# Patient Record
Sex: Male | Born: 1990
Health system: Southern US, Community
[De-identification: ages and names within clinical notes are randomized; demographics above are authoritative.]

## PROBLEM LIST (undated history)

## (undated) DIAGNOSIS — F419 Anxiety disorder, unspecified: Secondary | ICD-10-CM

## (undated) DIAGNOSIS — D649 Anemia, unspecified: Secondary | ICD-10-CM

## (undated) HISTORY — DX: Anemia, unspecified: D64.9

## (undated) HISTORY — DX: Anxiety disorder, unspecified: F41.9

---
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
## 2012-03-11 ENCOUNTER — Ambulatory Visit: Payer: Self-pay | Admitting: Orthopedic Surgery

## 2014-03-12 IMAGING — CT CT WRIST*L* W/O CM
1 series · 12 of 14 positions shown, 15 images · non-contrast
Comparison: none

REASON FOR EXAM: intraarticular fracture
COMMENTS:

PROCEDURE:     KCT - KCT WRIST LEFT WITHOUT CONTRAST  - March 11, 2012  [DATE]
RESULT:
TECHNIQUE: Helical 2 mm multiplanar images were obtained of the left wrist.

[Series 3: wrist 2.0 b70s · axial · 0.32mm/px · z∈[-139,-15]mm · 12 of 74 slices shown, 15 images]
[im 6/74  soft-tissue]
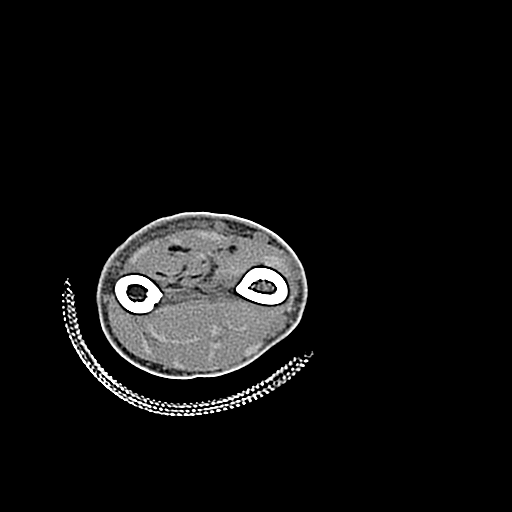
[im 6/74  bone]
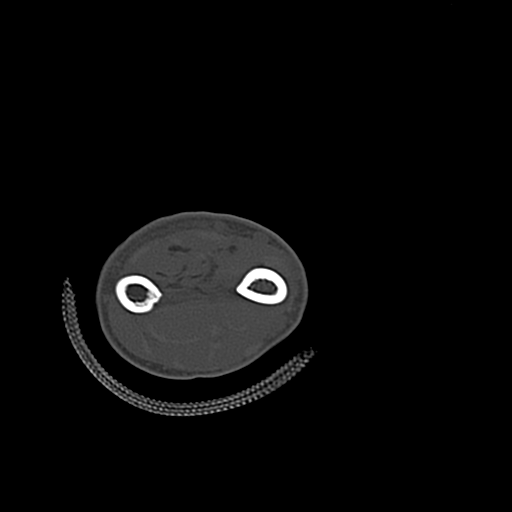
[im 12/74  bone]
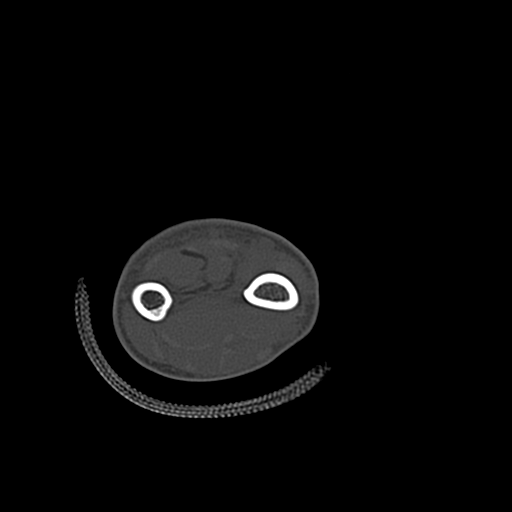
[im 17/74  bone]
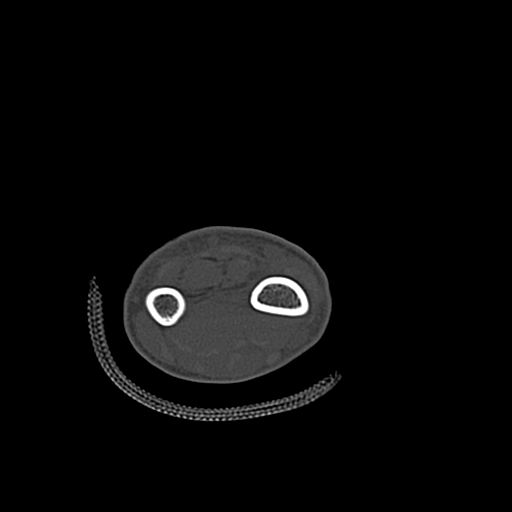
[im 23/74  bone]
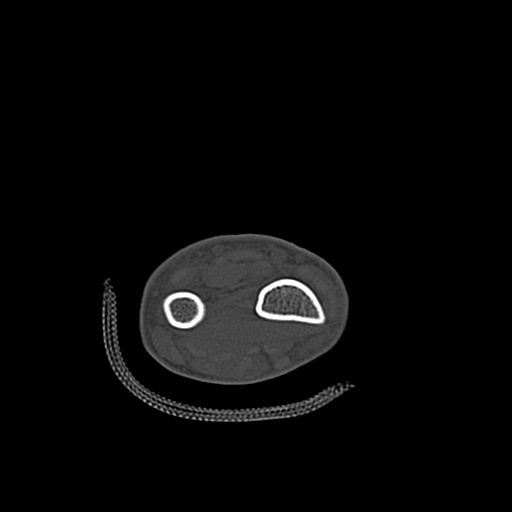
[im 29/74  soft-tissue]
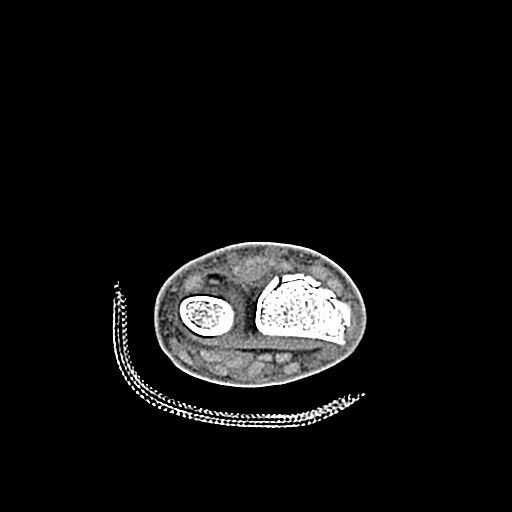
[im 29/74  bone]
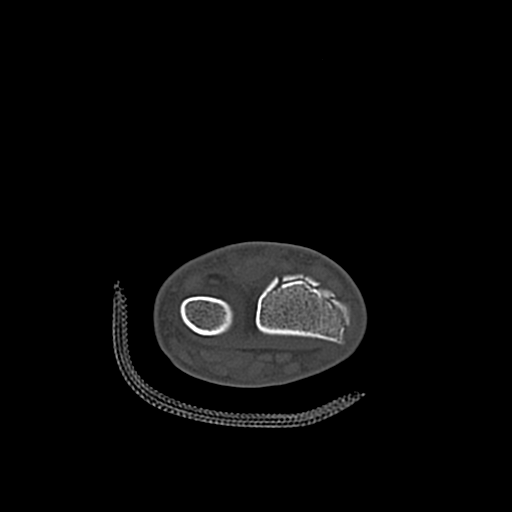
[im 34/74  bone]
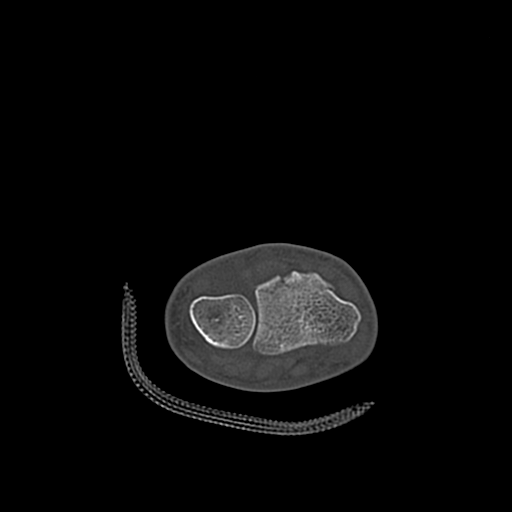
[im 40/74  bone]
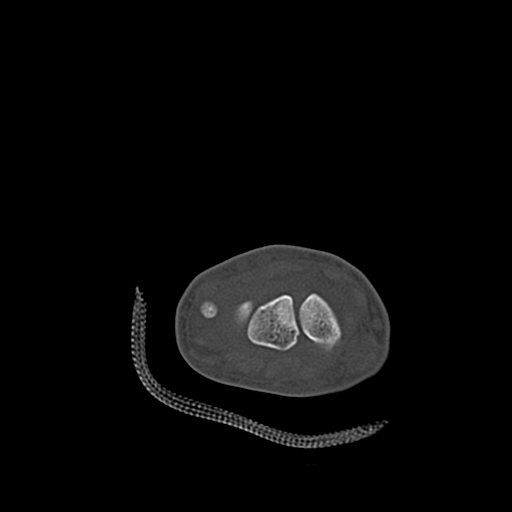
[im 45/74  bone]
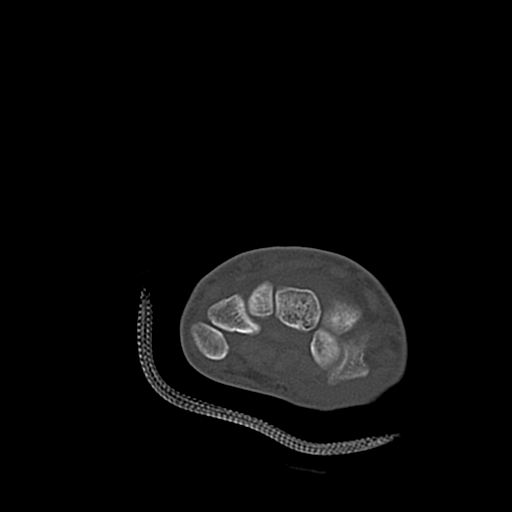
[im 51/74  soft-tissue]
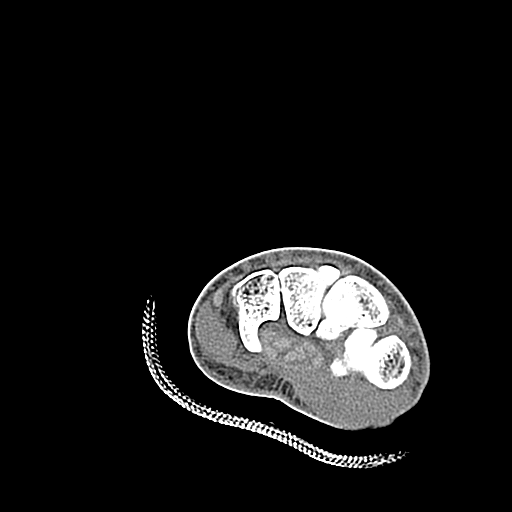
[im 51/74  bone]
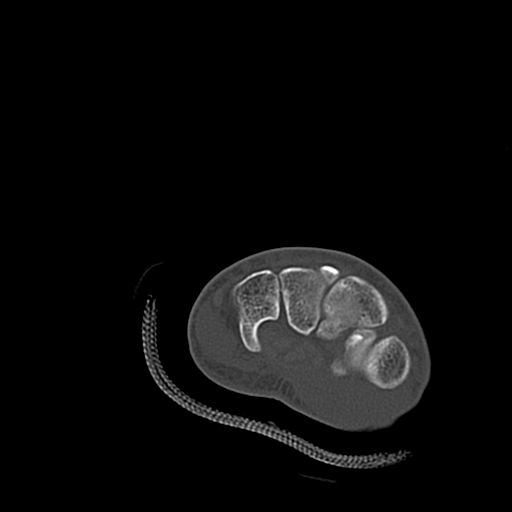
[im 57/74  bone]
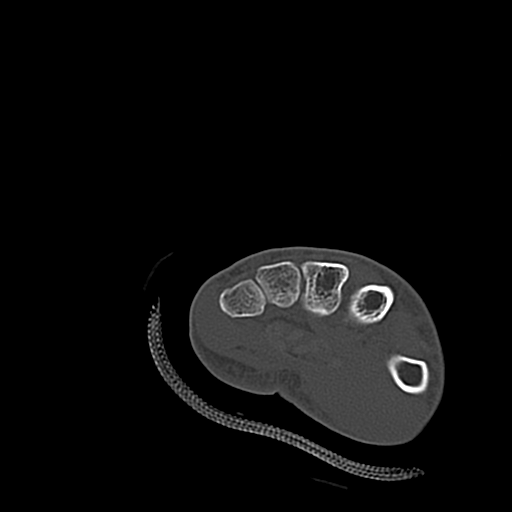
[im 62/74  bone]
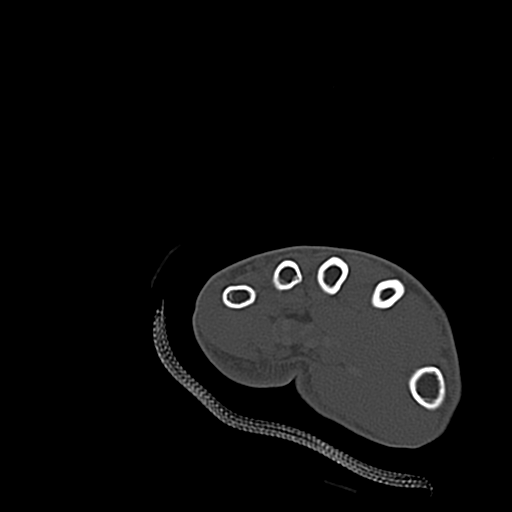
[im 68/74  bone]
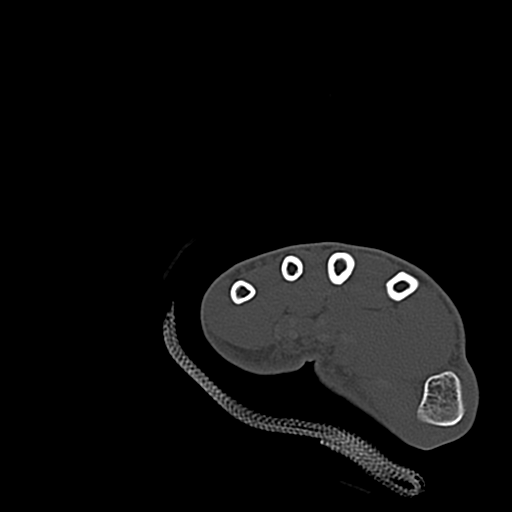

[12 of 14 positions shown; findings below may reference images not displayed]

FINDINGS: A comminuted mild to moderately impacted fracture is identified
within the distal metaphyseal region of the radius. There is extension of
the fracture into the distal radial ulnar joint. There does not appear to be
radiographic evidence of osseous fragments within the joint. There is no
evidence of extension into the radial carpal articulation. No further
fractures or dislocations are appreciated.
IMPRESSION: 1. Distal radius fracture as described above with extension into the distal
radial ulnar articulation.

## 2015-11-18 ENCOUNTER — Telehealth: Payer: Self-pay | Admitting: Family Medicine

## 2015-11-18 NOTE — Telephone Encounter (Signed)
Please review-aa 

## 2015-11-18 NOTE — Telephone Encounter (Signed)
Pt's mom Stanton KidneyDebra stated that she wanted pt to come in for OV b/c he hasn't been seen in awhile. Last OV was 08/11/12 a little over 3 years ago. Can pt be re-established? Thanks TNP

## 2015-11-18 NOTE — Telephone Encounter (Signed)
Called mom. No answer,LMTCB. Thanks TNP

## 2015-11-18 NOTE — Telephone Encounter (Signed)
That's fine

## 2015-11-22 NOTE — Telephone Encounter (Signed)
Called Mom's #. No answer. LMTCB. Thanks TNP

## 2020-08-23 ENCOUNTER — Telehealth: Payer: Self-pay

## 2020-08-23 NOTE — Telephone Encounter (Signed)
Copied from CRM [id number]. Topic: Appointment Scheduling - Scheduling Inquiry for Clinic >> Aug 23, 2020 12:30 PM Randol Kern wrote: Reason for CRM: Pt's mother called and reported that she wants Dr. Sherrie Mustache to consider taking pt on as a returning patient. She says the whole family is established with Dr. Sherrie Mustache. Pt is struggling with attention issues, wants to be evaluated. Brother is diagnosed with ADHD. Pt's mother wants to keep everyone in the family with Dr. Sherrie Mustache, family is also great friends Dr. Sullivan Lone.   She says herself, father, son, daughter, grandson, son-in-law  Bruce Pittman  Best contact: [phone]

## 2020-08-26 NOTE — Telephone Encounter (Signed)
That's fine

## 2020-08-29 NOTE — Telephone Encounter (Signed)
Patient scheduled for Mon, 10/10/20 first appointment available but mom want to know if it is possible for him to be seen before that date Please call her at ph# [phone]

## 2020-08-30 NOTE — Telephone Encounter (Signed)
There's nothing available

## 2020-10-10 ENCOUNTER — Ambulatory Visit (INDEPENDENT_AMBULATORY_CARE_PROVIDER_SITE_OTHER): Payer: BC Managed Care – PPO | Admitting: Family Medicine

## 2020-10-10 ENCOUNTER — Encounter: Payer: Self-pay | Admitting: Family Medicine

## 2020-10-10 ENCOUNTER — Other Ambulatory Visit: Payer: Self-pay

## 2020-10-10 VITALS — BP 119/79 | HR 103 | Temp 98.1°F | Resp 16 | Ht >= 80 in | Wt 177.0 lb

## 2020-10-10 DIAGNOSIS — F909 Attention-deficit hyperactivity disorder, unspecified type: Secondary | ICD-10-CM | POA: Diagnosis not present

## 2020-10-10 MED ORDER — AMPHETAMINE-DEXTROAMPHET ER 10 MG PO CP24: 10.0000 mg | ORAL_CAPSULE | Freq: Every day | ORAL | 0 refills | Status: DC

## 2020-10-10 NOTE — Progress Notes (Signed)
I,April Miller,acting as a scribe for [name], MD.,have documented all relevant documentation on the behalf of [name], MD,as directed by  [name], MD while in the presence of [name], MD.  New patient visit   Patient: Bruce Pittman   DOB: 1991/02/18   30 y.o. Male  MRN: [id number] Visit Date: 10/10/2020  Today's healthcare provider: [name], MD   Chief Complaint  Patient presents with   Establish Care   Subjective    Bruce Pittman is a 30 y.o. male who presents today as a new patient to establish care.  HPI  He is mainly here to discuss ADD symptoms, which runs in his family.  Associated degree [employer]. 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   Past Medical History:  Diagnosis Date   Anemia    Anxiety    History reviewed. No pertinent surgical history. No family status information on file.   History reviewed. No pertinent family history. Social History   Socioeconomic History   Marital status: Married    Spouse name: Not on file   Number of children: Not on file   Years of education: Not on file   Highest education level: Not on file  Occupational History   Not on file  Tobacco Use   Smoking status: Never   Smokeless tobacco: Current  Vaping Use   Vaping Use: Never used  Substance and Sexual Activity   Alcohol use: Yes   Drug use: Never   Sexual activity: Not on file  Other Topics Concern   Not on file  Social History Narrative   Not on file   Social Determinants of Health   Financial Resource Strain: Not on file  Food Insecurity: Not on file  Transportation Needs: Not on file  Physical Activity: Not on file  Stress: Not on file  Social Connections: Not on file   No outpatient medications prior to visit.   No facility-administered medications prior to visit.   Not on File   There  is no immunization history on file for this patient.  Health Maintenance  Topic Date Due   HIV Screening  Never done   Hepatitis C Screening  Never done   TETANUS/TDAP  Never done   INFLUENZA VACCINE  10/10/2020   Pneumococcal Vaccine 77-91 Years old  Aged Out   HPV VACCINES  Aged Out    Patient Care Team: Malva Limes, MD as PCP - General (Family Medicine)  Review of Systems  Constitutional:  Positive for activity change.  Psychiatric/Behavioral:  Positive for agitation, decreased concentration, dysphoric mood and sleep disturbance. The patient is nervous/anxious.   All other systems reviewed and are negative.    Objective    BP 119/79 (BP Location: Left Arm, Patient Position: Sitting, Cuff Size: Large)   Pulse (!) 103   Temp 98.1 F (36.7 C) (Temporal)   Resp 16   Ht 6\' 8"  (2.032 m)   Wt 177 lb (80.3 kg)   SpO2 96%   BMI 19.44 kg/m  Physical Exam  General: Appearance:    Thin male in no acute distress  Eyes:    PERRL, conjunctiva/corneas clear, EOM's intact       Lungs:     Clear to auscultation bilaterally, respirations unlabored  Heart:    Tachycardic. Normal rhythm. No murmurs, rubs, or gallops.  MS:   All extremities are intact.    Neurologic:   Awake, alert, oriented x 3. No apparent focal neurological defect.       See ASRS Flowsheet Depression Screen PHQ 2/9 Scores 10/10/2020  PHQ - 2 Score 0  PHQ- 9 Score 8     Assessment & Plan     1. Attention deficit hyperactivity disorder (ADHD), unspecified ADHD type Counseled on various treatment modalities including CBT and various medications. Discussed potential adverse effects of medications. Will try - amphetamine-dextroamphetamine (ADDERALL XR) 10 MG 24 hr capsule; Take 1 capsule (10 mg total) by mouth daily.  Dispense: 30 capsule; Refill: 0  Counseled to avoid stimulant medications. Is to follow up in 4 weeks to reassess.      The entirety of the information documented in the History of Present  Illness, Review of Systems and Physical Exam were personally obtained by me. Portions of this information were initially documented by the CMA and reviewed by me for thoroughness and accuracy.     [name], MD  [place] [phone] (phone) [phone] (fax)  [facility]

## 2020-11-08 ENCOUNTER — Other Ambulatory Visit: Payer: Self-pay | Admitting: Family Medicine

## 2020-11-08 DIAGNOSIS — F909 Attention-deficit hyperactivity disorder, unspecified type: Secondary | ICD-10-CM

## 2020-11-09 ENCOUNTER — Other Ambulatory Visit: Payer: Self-pay | Admitting: Family Medicine

## 2020-11-09 DIAGNOSIS — F909 Attention-deficit hyperactivity disorder, unspecified type: Secondary | ICD-10-CM

## 2020-11-09 MED ORDER — AMPHETAMINE-DEXTROAMPHET ER 10 MG PO CP24: 10.0000 mg | ORAL_CAPSULE | Freq: Every day | ORAL | 0 refills | Status: DC

## 2020-11-09 NOTE — Addendum Note (Signed)
Addended by: Malva Limes on: 11/09/2020 09:40 AM   Modules accepted: Orders

## 2020-11-10 NOTE — Telephone Encounter (Signed)
Please call pharmacy to see if this went through. I sent prescription to pharmacy twice yesterday and got message that transmission failed both times.

## 2020-11-15 ENCOUNTER — Encounter: Payer: Self-pay | Admitting: Family Medicine

## 2020-11-15 ENCOUNTER — Other Ambulatory Visit: Payer: Self-pay

## 2020-11-15 ENCOUNTER — Ambulatory Visit: Payer: BC Managed Care – PPO | Admitting: Family Medicine

## 2020-11-15 DIAGNOSIS — F909 Attention-deficit hyperactivity disorder, unspecified type: Secondary | ICD-10-CM | POA: Diagnosis not present

## 2020-11-15 MED ORDER — AMPHETAMINE-DEXTROAMPHET ER 15 MG PO CP24: 15.0000 mg | ORAL_CAPSULE | Freq: Every day | ORAL | 0 refills | Status: DC

## 2020-11-15 NOTE — Progress Notes (Signed)
      Established patient visit   Patient: Bruce Pittman   DOB: 01-31-1991   30 y.o. Male  MRN: [id number] Visit Date: 11/15/2020  Today's healthcare provider: [name], MD   Chief Complaint  Patient presents with   ADHD   Subjective    HPI   Follow up for ADHD  The patient was last seen for this 1  month  ago. Changes made at last visit include starting Adderall XR 10mg  daily.  He reports excellent compliance with treatment. He feels that condition is Improved. He is having side effects, but pt states the side effects have improved.    -----------------------------------------------------------------------------------------    Medications: Outpatient Medications Prior to Visit  Medication Sig   amphetamine-dextroamphetamine (ADDERALL XR) 10 MG 24 hr capsule TAKE 1 CAPSULE BY MOUTH DAILY.   No facility-administered medications prior to visit.    Review of Systems  Constitutional: Negative.   Respiratory: Negative.    Cardiovascular: Negative.   Gastrointestinal: Negative.   Psychiatric/Behavioral:  Positive for decreased concentration (Since being out of Adderall). Negative for agitation, behavioral problems, confusion, dysphoric mood, hallucinations, self-injury, sleep disturbance and suicidal ideas. The patient is not nervous/anxious and is not hyperactive.       Objective    BP 120/78 (BP Location: Right Arm, Patient Position: Sitting, Cuff Size: Normal)   Pulse 88   Wt 178 lb (80.7 kg)   SpO2 98%   BMI 19.55 kg/m    Physical Exam   General appearance: Thin male, cooperative and in no acute distress Head: Normocephalic, without obvious abnormality, atraumatic Respiratory: Respirations even and unlabored, normal respiratory rate Extremities: All extremities are intact.  Skin: Skin color, texture, turgor normal. No rashes seen  Psych: Appropriate mood and affect. Neurologic: Mental status: Alert, oriented to person, place, and time, thought  content appropriate.     Assessment & Plan     1. Attention deficit hyperactivity disorder (ADHD), unspecified ADHD type Tolerating starting dose of Adderall well, but sx worse again since running out of medications last week. He would like to try a little stronger dose, will restart at amphetamine-dextroamphetamine (ADDERALL XR) 15 MG 24 hr capsule; Take 1 capsule by mouth daily.  Dispense: 30 capsule; Refill: 0   Future Appointments  Date Time Provider Department Center  12/13/2020  4:00 PM 02/12/2021, MD [facility]    He declined flu vaccine.      The entirety of the information documented in the History of Present Illness, Review of Systems and Physical Exam were personally obtained by me. Portions of this information were initially documented by the CMA and reviewed by me for thoroughness and accuracy.     Malva Limes, MD  [facility] [phone] (phone) [phone] (fax)  [facility]

## 2020-12-13 ENCOUNTER — Encounter: Payer: Self-pay | Admitting: Family Medicine

## 2020-12-13 ENCOUNTER — Ambulatory Visit: Payer: BC Managed Care – PPO | Admitting: Family Medicine

## 2020-12-13 ENCOUNTER — Other Ambulatory Visit: Payer: Self-pay

## 2020-12-13 VITALS — BP 155/98 | HR 91 | Wt 173.0 lb

## 2020-12-13 DIAGNOSIS — F909 Attention-deficit hyperactivity disorder, unspecified type: Secondary | ICD-10-CM | POA: Diagnosis not present

## 2020-12-13 MED ORDER — AMPHETAMINE-DEXTROAMPHETAMINE 15 MG PO TABS: 15.0000 mg | ORAL_TABLET | Freq: Every day | ORAL | 0 refills | Status: DC

## 2020-12-13 NOTE — Patient Instructions (Signed)
Call or send a MyChart message 3-4 days before you need a refill of Adderall. Let me know if you want to continue the 15mg  immediate release or go back to the 10mg  extended release tablets

## 2020-12-13 NOTE — Progress Notes (Signed)
      Established patient visit   Patient: Bruce Pittman   DOB: Jan 29, 1991   30 y.o. Male  MRN: [id number] Visit Date: 12/13/2020  Today's healthcare provider: [name], MD   Chief Complaint  Patient presents with   ADHD   Subjective    HPI  Follow up for ADHD  The patient was last seen for this 1  month  ago. Changes made at last visit include increasing adderall to 15mg  daily.  He reports excellent compliance with treatment. He feels that condition is Improved. He is having side effects. Pt reports having trouble sleeping at night.   -----------------------------------------------------------------------------------------     Medications: Outpatient Medications Prior to Visit  Medication Sig   amphetamine-dextroamphetamine (ADDERALL XR) 15 MG 24 hr capsule Take 1 capsule by mouth daily.   No facility-administered medications prior to visit.    Review of Systems  Constitutional:  Positive for appetite change. Negative for activity change, chills, diaphoresis, fatigue, fever and unexpected weight change.  Respiratory: Negative.    Cardiovascular: Negative.   Gastrointestinal: Negative.   Psychiatric/Behavioral:  Positive for decreased concentration (Pt states this has improved.) and sleep disturbance. Negative for dysphoric mood, self-injury and suicidal ideas. The patient is not nervous/anxious.       Objective    BP (!) 155/98 (BP Location: Left Arm, Patient Position: Sitting, Cuff Size: Normal)   Pulse 91   Wt 173 lb (78.5 kg)   SpO2 100%   BMI 19.01 kg/m    Physical Exam   General appearance: Well developed, well nourished male, cooperative and in no acute distress Head: Normocephalic, without obvious abnormality, atraumatic Respiratory: Respirations even and unlabored, normal respiratory rate Extremities: All extremities are intact.  Skin: Skin color, texture, turgor normal. No rashes seen  Psych: Appropriate mood and affect. Neurologic:  Mental status: Alert, oriented to person, place, and time, thought content appropriate.    Assessment & Plan     1. Attention deficit hyperactivity disorder (ADHD), unspecified ADHD type Symptoms better but having more trouble falling asleep which was not a problem before starting medication will change from ER to IR amphetamine-dextroamphetamine (ADDERALL) 15 MG tablet; Take 1 tablet by mouth daily.  Dispense: 30 tablet; Refill: 0   He can call if he feels he needs to change to BID dosing. Otherwise: Future Appointments  Date Time Provider Department Center  03/22/2021  8:00 AM 05/20/2021, Sherrie Mustache, MD [facility]     He declined flu vaccine today.      The entirety of the information documented in the History of Present Illness, Review of Systems and Physical Exam were personally obtained by me. Portions of this information were initially documented by the CMA and reviewed by me for thoroughness and accuracy.     Demetrios Isaacs, MD  [facility] [phone] (phone) [phone] (fax)  [facility]

## 2021-01-10 ENCOUNTER — Other Ambulatory Visit: Payer: Self-pay | Admitting: Family Medicine

## 2021-01-10 DIAGNOSIS — F909 Attention-deficit hyperactivity disorder, unspecified type: Secondary | ICD-10-CM

## 2021-01-10 MED ORDER — AMPHETAMINE-DEXTROAMPHETAMINE 15 MG PO TABS: 15.0000 mg | ORAL_TABLET | Freq: Every day | ORAL | 0 refills | Status: DC

## 2021-01-10 NOTE — Telephone Encounter (Signed)
Last refill: 12/13/2020, qty 30 with 0 refills  Last office visit:03/22/2021 Next office visit: 12/13/2020

## 2021-02-07 ENCOUNTER — Other Ambulatory Visit: Payer: Self-pay | Admitting: Family Medicine

## 2021-02-07 DIAGNOSIS — F909 Attention-deficit hyperactivity disorder, unspecified type: Secondary | ICD-10-CM

## 2021-02-07 MED ORDER — AMPHETAMINE-DEXTROAMPHETAMINE 15 MG PO TABS: 15.0000 mg | ORAL_TABLET | Freq: Every day | ORAL | 0 refills | Status: DC

## 2021-03-07 ENCOUNTER — Other Ambulatory Visit: Payer: Self-pay | Admitting: Family Medicine

## 2021-03-07 DIAGNOSIS — F909 Attention-deficit hyperactivity disorder, unspecified type: Secondary | ICD-10-CM

## 2021-03-07 MED ORDER — AMPHETAMINE-DEXTROAMPHETAMINE 15 MG PO TABS: 15.0000 mg | ORAL_TABLET | Freq: Every day | ORAL | 0 refills | Status: DC

## 2021-03-22 ENCOUNTER — Ambulatory Visit: Payer: BC Managed Care – PPO | Admitting: Family Medicine

## 2021-04-05 ENCOUNTER — Encounter: Payer: Self-pay | Admitting: Family Medicine

## 2021-04-05 ENCOUNTER — Ambulatory Visit: Payer: BC Managed Care – PPO | Admitting: Family Medicine

## 2021-04-05 ENCOUNTER — Other Ambulatory Visit: Payer: Self-pay

## 2021-04-05 VITALS — BP 153/96 | HR 116 | Temp 98.7°F | Resp 16 | Wt 178.0 lb

## 2021-04-05 DIAGNOSIS — R000 Tachycardia, unspecified: Secondary | ICD-10-CM | POA: Diagnosis not present

## 2021-04-05 DIAGNOSIS — F909 Attention-deficit hyperactivity disorder, unspecified type: Secondary | ICD-10-CM

## 2021-04-05 DIAGNOSIS — R030 Elevated blood-pressure reading, without diagnosis of hypertension: Secondary | ICD-10-CM | POA: Diagnosis not present

## 2021-04-05 MED ORDER — AMPHETAMINE-DEXTROAMPHETAMINE 15 MG PO TABS: 15.0000 mg | ORAL_TABLET | Freq: Every day | ORAL | 0 refills | Status: DC

## 2021-04-05 NOTE — Progress Notes (Signed)
°  ° ° °  Established patient visit   Patient: Bruce Pittman   DOB: Jul 17, 1990   31 y.o. Male  MRN: [id number] Visit Date: 04/05/2021  Today's healthcare provider: [name], MD   Chief Complaint  Patient presents with   ADHD   Subjective    HPI  Follow up for ADHD:  The patient was last seen for this on 12/13/2020.   Changes made at last visit include changing Adderall ER to IR amphetamine-dextroamphetamine (ADDERALL) 15 MG tablet; Take 1 tablet by mouth daily.  He reports good compliance with treatment. He feels that condition is Improved. He is not having side effects.   -----------------------------------------------------------------------------------------   Medications: Outpatient Medications Prior to Visit  Medication Sig   amphetamine-dextroamphetamine (ADDERALL) 15 MG tablet Take 1 tablet by mouth daily.   No facility-administered medications prior to visit.    Review of Systems  Constitutional:  Negative for appetite change, chills and fever.  Respiratory:  Negative for chest tightness, shortness of breath and wheezing.   Cardiovascular:  Negative for chest pain and palpitations.  Gastrointestinal:  Negative for abdominal pain, nausea and vomiting.      Objective    BP (!) 153/96 (BP Location: Right Arm, Patient Position: Sitting, Cuff Size: Normal)    Pulse (!) 116    Temp 98.7 F (37.1 C) (Oral)    Resp 16    Wt 178 lb (80.7 kg)    SpO2 100% Comment: room air   BMI 19.55 kg/m  {Show previous vital signs (optional):23777}  Today's Vitals   04/05/21 1101 04/05/21 1106  BP: (!) 155/89 (!) 153/96  Pulse: 100 (!) 116  Resp: 16   Temp: 98.7 F (37.1 C)   TempSrc: Oral   SpO2: 100%   Weight: 178 lb (80.7 kg)    Body mass index is 19.55 kg/m.   Physical Exam    General: Appearance:    Thin male in no acute distress  Eyes:    PERRL, conjunctiva/corneas clear, EOM's intact       Lungs:     Clear to auscultation bilaterally, respirations  unlabored  Heart:    Tachycardic. Normal rhythm. No murmurs, rubs, or gallops.    MS:   All extremities are intact.    Neurologic:   Awake, alert, oriented x 3. No apparent focal neurological defect.        EKG: ST   Assessment & Plan     1. Tachycardia  - EKG 12-Lead  2. Elevated blood pressure reading  - CBC - Comprehensive metabolic panel - TSH  Consider adding CCB if labs are normal.   3. Attention deficit hyperactivity disorder (ADHD), unspecified ADHD type He feels IR Adderall is working better, but is noted to have elevated BP and heart rate as above. He does feel more anxious especially when coming to the Doctor's office, but he also has family history of hypertension.   Will continue current dose of Adderall now and monitory BP and HR.       The entirety of the information documented in the History of Present Illness, Review of Systems and Physical Exam were personally obtained by me. Portions of this information were initially documented by the CMA and reviewed by me for thoroughness and accuracy.     [name], MD  [facility] [phone] (phone) [phone] (fax)  [facility]

## 2021-05-02 DIAGNOSIS — R030 Elevated blood-pressure reading, without diagnosis of hypertension: Secondary | ICD-10-CM | POA: Diagnosis not present

## 2021-05-02 DIAGNOSIS — R000 Tachycardia, unspecified: Secondary | ICD-10-CM | POA: Diagnosis not present

## 2021-05-03 LAB — TSH: TSH: 1.82 u[IU]/mL (ref 0.450–4.500)

## 2021-05-03 LAB — COMPREHENSIVE METABOLIC PANEL
ALT: 11 IU/L (ref 0–44)
AST: 24 IU/L (ref 0–40)
Albumin/Globulin Ratio: 2.4 — ABNORMAL HIGH (ref 1.2–2.2)
Albumin: 5.2 g/dL (ref 4.1–5.2)
Alkaline Phosphatase: 64 IU/L (ref 44–121)
BUN/Creatinine Ratio: 8 — ABNORMAL LOW (ref 9–20)
BUN: 9 mg/dL (ref 6–20)
Bilirubin Total: 0.4 mg/dL (ref 0.0–1.2)
CO2: 24 mmol/L (ref 20–29)
Calcium: 9.8 mg/dL (ref 8.7–10.2)
Chloride: 100 mmol/L (ref 96–106)
Creatinine, Ser: 1.07 mg/dL (ref 0.76–1.27)
Globulin, Total: 2.2 g/dL (ref 1.5–4.5)
Glucose: 93 mg/dL (ref 70–99)
Potassium: 4 mmol/L (ref 3.5–5.2)
Sodium: 139 mmol/L (ref 134–144)
Total Protein: 7.4 g/dL (ref 6.0–8.5)
eGFR: 96 mL/min/{1.73_m2} (ref >59)

## 2021-05-03 LAB — CBC
Hematocrit: 44.5 % (ref 37.5–51.0)
Hemoglobin: 14.9 g/dL (ref 13.0–17.7)
MCH: 29.6 pg (ref 26.6–33.0)
MCHC: 33.5 g/dL (ref 31.5–35.7)
MCV: 88 fL (ref 79–97)
Platelets: 243 10*3/uL (ref 150–450)
RBC: 5.04 x10E6/uL (ref 4.14–5.80)
RDW: 12.7 % (ref 11.6–15.4)
WBC: 4.8 10*3/uL (ref 3.4–10.8)

## 2021-05-04 ENCOUNTER — Other Ambulatory Visit: Payer: Self-pay | Admitting: Family Medicine

## 2021-05-04 ENCOUNTER — Telehealth: Payer: Self-pay

## 2021-05-04 DIAGNOSIS — R030 Elevated blood-pressure reading, without diagnosis of hypertension: Secondary | ICD-10-CM

## 2021-05-04 DIAGNOSIS — F909 Attention-deficit hyperactivity disorder, unspecified type: Secondary | ICD-10-CM

## 2021-05-04 DIAGNOSIS — R000 Tachycardia, unspecified: Secondary | ICD-10-CM

## 2021-05-04 MED ORDER — DILTIAZEM HCL ER COATED BEADS 120 MG PO CP24: 120.0000 mg | ORAL_CAPSULE | Freq: Every day | ORAL | 1 refills | Status: DC

## 2021-05-04 NOTE — Telephone Encounter (Signed)
It looks like lab note/recommendations from Dr. Sherrie Mustache did not get routed after being reviewed. Message transferred to this phone message.Tried calling patient. Left message to call back. OK for [facility] triage to advise.

## 2021-05-04 NOTE — Telephone Encounter (Signed)
Last refill: 04/05/2021 #30 with 0 refills  Last office visit: 04/05/2021 No future visit scheduled.

## 2021-05-04 NOTE — Telephone Encounter (Signed)
Prescription sent into pharmacy

## 2021-05-04 NOTE — Telephone Encounter (Signed)
Bruce Limes, MD  05/03/2021  8:15 AM EST Back to Top    Labs are all good. He needs to start diltiazem CR (24 hour) 120mg  one daily, please send in prescription for #30 with 1 refill and schedule follow up in a month.

## 2021-05-04 NOTE — Telephone Encounter (Signed)
Pt returned call, message read to pt, verbalizes understanding. States please send Diltiazem to current preferred pharmacy.

## 2021-05-05 MED ORDER — AMPHETAMINE-DEXTROAMPHETAMINE 15 MG PO TABS: 15.0000 mg | ORAL_TABLET | Freq: Every day | ORAL | 0 refills | Status: DC

## 2021-05-29 ENCOUNTER — Ambulatory Visit: Payer: BC Managed Care – PPO | Admitting: Family Medicine

## 2021-05-29 ENCOUNTER — Encounter: Payer: Self-pay | Admitting: Family Medicine

## 2021-05-29 ENCOUNTER — Other Ambulatory Visit: Payer: Self-pay

## 2021-05-29 VITALS — BP 158/101 | HR 101 | Temp 98.0°F | Resp 14 | Wt 181.0 lb

## 2021-05-29 DIAGNOSIS — R000 Tachycardia, unspecified: Secondary | ICD-10-CM

## 2021-05-29 DIAGNOSIS — R030 Elevated blood-pressure reading, without diagnosis of hypertension: Secondary | ICD-10-CM

## 2021-05-29 DIAGNOSIS — F909 Attention-deficit hyperactivity disorder, unspecified type: Secondary | ICD-10-CM

## 2021-05-29 NOTE — Progress Notes (Signed)
?  ? ?I,Roshena L Chambers,acting as a scribe for [name], MD.,have documented all relevant documentation on the behalf of [name], MD,as directed by  [name], MD while in the presence of [name], MD.  ? ? ?Established patient visit ? ? ?Patient: Bruce Pittman   DOB: 1990-12-22   31 y.o. Male  MRN: [id number] ?Visit Date: 05/29/2021 ? ?Today's healthcare provider: [name], MD  ? ?Chief Complaint  ?Patient presents with  ? Blood Pressure Check  ? Tachycardia  ? ?Subjective  ?  ?HPI  ?Follow up for tachycardia and elevated blood pressure: ? ?The patient was last seen for this on 04/05/2021 shen his BP was 153/96 and his HR was 116 ?Changes made at last visit include prescribing  Diltiazem CR (24 hour) 120mg  one daily. ? ?He reports poor compliance with treatment. Patient did not start medication due to concerns about taking a blood pressure medication. ?He feels that condition is Unchanged. ?He is not having side effects.  ? ?He has also been on current dose of Adderall for last 6 months, which he feels has been very helpful with focus, attention, and social anxiety. However he states he has not taken medication since yesterday morning, that he feels very relaxed today, but HR and BP are still elevated.  ?-----------------------------------------------------------------------------------------  ? ?Medications: ?Outpatient Medications Prior to Visit  ?Medication Sig  ? amphetamine-dextroamphetamine (ADDERALL) 15 MG tablet Take 1 tablet by mouth daily.  ? diltiazem (CARDIZEM CD) 120 MG 24 hr capsule Take 1 capsule (120 mg total) by mouth daily. (Patient not taking: Reported on 05/29/2021)  ? ?No facility-administered medications prior to visit.  ? ? ?Review of Systems  ?Constitutional:  Negative for appetite change, chills and fever.  ?Respiratory:  Negative for chest tightness, shortness of breath and wheezing.   ?Cardiovascular:  Negative for chest pain and palpitations.   ?Gastrointestinal:  Negative for abdominal pain, nausea and vomiting.  ? ? ?  Objective  ?  ?BP (!) 158/101 (BP Location: Right Arm, Patient Position: Sitting, Cuff Size: Normal)   Pulse (!) 101   Temp 98 ?F (36.7 ?C) (Oral)   Resp 14   Wt 181 lb (82.1 kg)   SpO2 100% Comment: room air  BMI 19.88 kg/m?  ? ? ?Today's Vitals  ? 05/29/21 0853 05/29/21 0856  ?BP: (!) 153/93 (!) 158/101  ?Pulse: 97 (!) 101  ?Resp: 14   ?Temp: 98 ?F (36.7 ?C)   ?TempSrc: Oral   ?SpO2: 100%   ?Weight: 181 lb (82.1 kg)   ? ?Body mass index is 19.88 kg/m?.  ? ?Physical Exam  ? ?General appearance: Thin male, cooperative and in no acute distress ?Head: Normocephalic, without obvious abnormality, atraumatic ?Respiratory: Respirations even and unlabored, normal respiratory rate ?Extremities: All extremities are intact.  ?Skin: Skin color, texture, turgor normal. No rashes seen  ?Psych: Appropriate mood and affect. ?Neurologic: Mental status: Alert, oriented to person, place, and time, thought content appropriate.  ? ? ? Assessment & Plan  ?  ? ?1. Tachycardia ?Counseled that this could be SE of Adderall, however he is prescribed IR formulation and last dose was 24 hours ago, and his baseline HR was mildly elevated. Discussed stopping Adderall to see how much effect it is having on BP and HR, but he is benefiting significantly from that medication and I doubt it is having a significant on HR and BP  ? ?2. Elevated blood pressure reading ? ?Will start diltiazem CD 120 as prescribed  at last visit and follow up 4 weeks. To check BP.  ? ?3. Attention deficit hyperactivity disorder (ADHD), unspecified ADHD type ? Is benefiting significantly from current dose of Adderall.  ?   ? ?The entirety of the information documented in the History of Present Illness, Review of Systems and Physical Exam were personally obtained by me. Portions of this information were initially documented by the CMA and reviewed by me for thoroughness and accuracy.    ? ? ?[name], MD  ?[facility] ?[phone] (phone) ?[phone] (fax) ? ?[facility]  ?

## 2021-06-05 ENCOUNTER — Other Ambulatory Visit: Payer: Self-pay | Admitting: Family Medicine

## 2021-06-05 ENCOUNTER — Ambulatory Visit: Payer: BC Managed Care – PPO | Admitting: Family Medicine

## 2021-06-05 DIAGNOSIS — F909 Attention-deficit hyperactivity disorder, unspecified type: Secondary | ICD-10-CM

## 2021-06-05 MED ORDER — AMPHETAMINE-DEXTROAMPHETAMINE 15 MG PO TABS: 15.0000 mg | ORAL_TABLET | Freq: Every day | ORAL | 0 refills | Status: DC

## 2021-06-05 NOTE — Telephone Encounter (Signed)
This is duplicate but I am unable to refuse it ?

## 2021-06-26 ENCOUNTER — Ambulatory Visit: Payer: BC Managed Care – PPO | Admitting: Family Medicine

## 2021-07-04 ENCOUNTER — Other Ambulatory Visit: Payer: Self-pay | Admitting: Family Medicine

## 2021-07-04 DIAGNOSIS — F909 Attention-deficit hyperactivity disorder, unspecified type: Secondary | ICD-10-CM

## 2021-07-04 MED ORDER — AMPHETAMINE-DEXTROAMPHETAMINE 15 MG PO TABS: 15.0000 mg | ORAL_TABLET | Freq: Every day | ORAL | 0 refills | Status: DC

## 2021-07-12 ENCOUNTER — Ambulatory Visit: Payer: BC Managed Care – PPO | Admitting: Family Medicine

## 2021-07-31 ENCOUNTER — Ambulatory Visit
Admission: EM | Admit: 2021-07-31 | Discharge: 2021-07-31 | Disposition: A | Discharge status: Home / Self Care | Payer: BC Managed Care – PPO | Attending: Urgent Care | Admitting: Urgent Care

## 2021-07-31 ENCOUNTER — Encounter: Payer: Self-pay | Admitting: Emergency Medicine

## 2021-07-31 DIAGNOSIS — J069 Acute upper respiratory infection, unspecified: Secondary | ICD-10-CM | POA: Diagnosis not present

## 2021-07-31 DIAGNOSIS — R052 Subacute cough: Secondary | ICD-10-CM

## 2021-07-31 DIAGNOSIS — R0982 Postnasal drip: Secondary | ICD-10-CM | POA: Diagnosis not present

## 2021-07-31 DIAGNOSIS — R070 Pain in throat: Secondary | ICD-10-CM

## 2021-07-31 MED ORDER — CETIRIZINE HCL 10 MG PO TABS: 10.0000 mg | ORAL_TABLET | Freq: Every day | ORAL | 0 refills | Status: DC

## 2021-07-31 MED ORDER — PSEUDOEPHEDRINE HCL 60 MG PO TABS: 60.0000 mg | ORAL_TABLET | Freq: Three times a day (TID) | ORAL | 0 refills | Status: DC | PRN

## 2021-07-31 MED ORDER — BENZONATATE 100 MG PO CAPS: 100.0000 mg | ORAL_CAPSULE | Freq: Three times a day (TID) | ORAL | 0 refills | Status: DC | PRN

## 2021-07-31 MED ORDER — PROMETHAZINE-DM 6.25-15 MG/5ML PO SYRP: 5.0000 mL | ORAL_SOLUTION | Freq: Every evening | ORAL | 0 refills | Status: DC | PRN

## 2021-07-31 NOTE — Discharge Instructions (Addendum)
We will manage this as a viral illness. For sore throat or cough try using a honey-based tea. Use 3 teaspoons of honey with juice squeezed from half lemon. Place shaved pieces of ginger into 1/2-1 cup of water and warm over stove top. Then mix the ingredients and repeat every 4 hours as needed. Please take ibuprofen 600mg every 6 hours with food alternating with OR taken together with Tylenol 500mg-650mg every 6 hours for throat pain, fevers, aches and pains. Hydrate very well with at least 2 liters of water. Eat light meals such as soups (chicken and noodles, vegetable, chicken and wild rice).  Do not eat foods that you are allergic to.  Taking an antihistamine like Zyrtec can help against postnasal drainage, sinus congestion which can cause sinus pain, sinus headaches, throat pain, painful swallowing, coughing.  You can take this together with pseudoephedrine (Sudafed) at a dose of 30-60 mg 3 times a day or twice daily as needed for the same kind of nasal drip, congestion.  However, limit your use of pseudoephedrine if you have high blood pressure or avoid altogether if you have abnormal heart rhythms, heart condition.  

## 2021-07-31 NOTE — Progress Notes (Deleted)
      Established patient visit   Patient: Bruce Pittman   DOB: 1991/02/05   31 y.o. Male  MRN: [id number] Visit Date: 08/01/2021  Today's healthcare provider: Caro Laroche, DO   No chief complaint on file.  Subjective    HPI  Upper respiratory symptoms He complains of {uri sx's' brief:15453}.with {systemic_sx:15294}. Onset of symptoms was {onset initial:119223} and {progression:119226}.He {hydration history:15378}.  Past history is significant for {respiratory illness:412}. Patient is {smoker?:15292}  ---------------------------------------------------------------------------------------------------   Medications: Outpatient Medications Prior to Visit  Medication Sig   amphetamine-dextroamphetamine (ADDERALL) 15 MG tablet Take 1 tablet by mouth daily.   diltiazem (CARDIZEM CD) 120 MG 24 hr capsule Take 1 capsule (120 mg total) by mouth daily. (Patient not taking: Reported on 05/29/2021)   No facility-administered medications prior to visit.    Review of Systems  {Labs  Heme  Chem  Endocrine  Serology  Results Review (optional):23779}   Objective    There were no vitals taken for this visit. {Show previous vital signs (optional):23777}  Physical Exam  ***  No results found for any visits on 08/01/21.  Assessment & Plan     ***  No follow-ups on file.      {provider attestation***:1}   Caro Laroche, DO  [facility] [phone] (phone) [phone] (fax)  [facility]

## 2021-07-31 NOTE — ED Provider Notes (Signed)
[facility]RG[facility]   MRN: [id number] DOB: 15-Feb-1991  Subjective:   Bruce Pittman is a 31 y.o. male presenting for 3-day history of acute onset persistent sinus congestion, fever, body aches.  Has had a mild throat pain, cough.  No chest pain, shortness of breath or wheezing.  Patient is non-smoker.  No vaping.  No current facility-administered medications for this encounter.  Current Outpatient Medications:    amphetamine-dextroamphetamine (ADDERALL) 15 MG tablet, Take 1 tablet by mouth daily., Disp: 30 tablet, Rfl: 0   diltiazem (CARDIZEM CD) 120 MG 24 hr capsule, Take 1 capsule (120 mg total) by mouth daily. (Patient not taking: Reported on 05/29/2021), Disp: 30 capsule, Rfl: 1   No Known Allergies  Past Medical History:  Diagnosis Date   Anemia    Anxiety      History reviewed. No pertinent surgical history.  History reviewed. No pertinent family history.  Social History   Tobacco Use   Smoking status: Never   Smokeless tobacco: Current  Vaping Use   Vaping Use: Never used  Substance Use Topics   Alcohol use: Yes   Drug use: Never    ROS   Objective:   Vitals: BP 131/80   Pulse (!) 103   Temp 98 F (36.7 C)   Resp 20   SpO2 98%   Physical Exam Constitutional:      General: He is not in acute distress.    Appearance: Normal appearance. He is well-developed and normal weight. He is not ill-appearing, toxic-appearing or diaphoretic.  HENT:     Head: Normocephalic and atraumatic.     Right Ear: Tympanic membrane, ear canal and external ear normal. There is no impacted cerumen.     Left Ear: Tympanic membrane, ear canal and external ear normal. There is no impacted cerumen.     Nose: Congestion present. No rhinorrhea.     Mouth/Throat:     Mouth: Mucous membranes are moist.     Pharynx: No oropharyngeal exudate or posterior oropharyngeal erythema.     Comments: Cobblestone pattern postnasal drainage overlying pharynx. Eyes:      General: No scleral icterus.       Right eye: No discharge.        Left eye: No discharge.     Extraocular Movements: Extraocular movements intact.     Conjunctiva/sclera: Conjunctivae normal.  Cardiovascular:     Rate and Rhythm: Normal rate and regular rhythm.     Heart sounds: Normal heart sounds. No murmur heard.   No friction rub. No gallop.  Pulmonary:     Effort: Pulmonary effort is normal. No respiratory distress.     Breath sounds: Normal breath sounds. No stridor. No wheezing, rhonchi or rales.  Musculoskeletal:     Cervical back: Normal range of motion and neck supple. No rigidity. No muscular tenderness.  Neurological:     General: No focal deficit present.     Mental Status: He is alert and oriented to person, place, and time.  Psychiatric:        Mood and Affect: Mood normal.        Behavior: Behavior normal.        Thought Content: Thought content normal.    Assessment and Plan :   PDMP not reviewed this encounter.  1. Viral upper respiratory illness   2. Throat pain   3. Post-nasal drainage   4. Subacute cough    Patient declined COVID-19 testing and I am in  agreement. Deferred imaging given clear cardiopulmonary exam, hemodynamically stable vital signs. Suspect viral URI, viral syndrome. Physical exam findings reassuring and vital signs stable for discharge. Advised supportive care, offered symptomatic relief. Counseled patient on potential for adverse effects with medications prescribed/recommended today, ER and return-to-clinic precautions discussed, patient verbalized understanding.     Wallis Bamberg, [place] 07/31/21 [phone]

## 2021-07-31 NOTE — ED Triage Notes (Signed)
Pt here with cough, congestion, body  aches and fever x 3 days.

## 2021-08-01 ENCOUNTER — Ambulatory Visit: Payer: BC Managed Care – PPO | Admitting: Family Medicine

## 2021-08-01 ENCOUNTER — Other Ambulatory Visit: Payer: Self-pay | Admitting: Family Medicine

## 2021-08-01 DIAGNOSIS — F909 Attention-deficit hyperactivity disorder, unspecified type: Secondary | ICD-10-CM

## 2021-08-01 MED ORDER — AMPHETAMINE-DEXTROAMPHETAMINE 15 MG PO TABS: 15.0000 mg | ORAL_TABLET | Freq: Every day | ORAL | 0 refills | Status: DC

## 2021-08-09 NOTE — Progress Notes (Unsigned)
I,Roshena L Chambers,acting as a scribe for [name], MD.,have documented all relevant documentation on the behalf of [name], MD,as directed by  [name], MD while in the presence of [name], MD.   Established patient visit   Patient: Bruce Pittman   DOB: September 09, 1990   31 y.o. Male  MRN: [id number] Visit Date: 08/11/2021  Today's healthcare provider: [name], MD   Chief Complaint  Patient presents with   Hypertension   Subjective    HPI  Hypertension, follow-up  BP Readings from Last 3 Encounters:  08/11/21 (!) 141/96  07/31/21 131/80  05/29/21 (!) 158/101   Wt Readings from Last 3 Encounters:  08/11/21 180 lb (81.6 kg)  05/29/21 181 lb (82.1 kg)  04/05/21 178 lb (80.7 kg)     He was last seen for hypertension 2 months ago.  BP at that visit was 158/101. Management since that visit includes prescribing diltiazem CD  He reports poor compliance with treatment. Patient did not start medication due to wanting  a second opinion from another doctor.  He reports he was seen at an urgent care last week for URI and his blood pressure was 131/80.  He is not having side effects.  He is following a Regular diet. He is not exercising. He does not smoke.  He is noted to be taking Adderall 15 QAM, but states he did not take medication yet this morning.   Outside blood pressures are not checked. Symptoms: No chest pain No chest pressure  No palpitations No syncope  No dyspnea No orthopnea  No paroxysmal nocturnal dyspnea No lower extremity edema    ---------------------------------------------------------------------------------------------------  He is also here to follow up ADD, last seen for this 2 months ago when we changed from 15mg  ER to 15mg  IR QAM due to having difficulty sleepy. He states he is sleeping better now, but medication seems to wear off near the end of the day.   Medications: Outpatient Medications Prior to Visit   Medication Sig   amphetamine-dextroamphetamine (ADDERALL) 15 MG tablet Take 1 tablet by mouth daily.   cetirizine (ZYRTEC ALLERGY) 10 MG tablet Take 1 tablet (10 mg total) by mouth daily.   benzonatate (TESSALON) 100 MG capsule Take 1-2 capsules (100-200 mg total) by mouth 3 (three) times daily as needed for cough. (Patient not taking: Reported on 08/11/2021)   diltiazem (CARDIZEM CD) 120 MG 24 hr capsule Take 1 capsule (120 mg total) by mouth daily. (Patient not taking: Reported on 08/11/2021)   promethazine-dextromethorphan (PROMETHAZINE-DM) 6.25-15 MG/5ML syrup Take 5 mLs by mouth at bedtime as needed for cough. (Patient not taking: Reported on 08/11/2021)   pseudoephedrine (SUDAFED) 60 MG tablet Take 1 tablet (60 mg total) by mouth every 8 (eight) hours as needed for congestion. (Patient not taking: Reported on 08/11/2021)   No facility-administered medications prior to visit.    Review of Systems  Constitutional:  Negative for appetite change, chills and fever.  Respiratory:  Negative for chest tightness, shortness of breath and wheezing.   Cardiovascular:  Negative for chest pain and palpitations.  Gastrointestinal:  Negative for abdominal pain, nausea and vomiting.      Objective    BP (!) 141/96 (BP Location: Left Arm, Patient Position: Sitting, Cuff Size: Normal)   Pulse 93   Temp 97.9 F (36.6 C) (Oral)   Resp 16   Wt 180 lb (81.6 kg)   SpO2 100% Comment: room air  BMI 19.77 kg/m  I,Roshena L Chambers,acting as a scribe for [name], MD.,have documented all relevant documentation on the behalf of [name], MD,as directed by  [name], MD while in the presence of [name], MD.    Physical Exam   General appearance: Thin male, cooperative and in no acute distress Head: Normocephalic, without obvious abnormality, atraumatic Respiratory: Respirations even and unlabored, normal respiratory rate Extremities: All extremities are intact.  Skin: Skin color,  texture, turgor normal. No rashes seen  Psych: Appropriate mood and affect. Neurologic: Mental status: Alert, oriented to person, place, and time, thought content appropriate.   Assessment & Plan     1. Elevated blood pressure reading Still elevated, but improved today, but did not start diltiazem prescribed at last office visit. He much prefers to avoid medications and work on lifestyle modifications. Encourage to limit sodium intake, exercise regularly and to cut back on coffee consumption to no more than 2 cups a day.   2. Attention deficit hyperactivity disorder (ADHD), unspecified ADHD type Is sleeping better since change from ER to IR formulation of Adderall, but medication effect is wearing off too early in the day. Will change from 15mg  QD to 10mg  BID.   Future Appointments  Date Time Provider Department Center  09/22/2021  8:20 AM 09/24/2021, MD [facility]        The entirety of the information documented in the History of Present Illness, Review of Systems and Physical Exam were personally obtained by me. Portions of this information were initially documented by the CMA and reviewed by me for thoroughness and accuracy.     Sherrie Mustache, MD  [facility] [phone] (phone) [phone] (fax)  [facility]

## 2021-08-11 ENCOUNTER — Ambulatory Visit: Payer: BC Managed Care – PPO | Admitting: Family Medicine

## 2021-08-11 ENCOUNTER — Encounter: Payer: Self-pay | Admitting: Family Medicine

## 2021-08-11 VITALS — BP 141/96 | HR 93 | Temp 97.9°F | Resp 16 | Wt 180.0 lb

## 2021-08-11 DIAGNOSIS — R030 Elevated blood-pressure reading, without diagnosis of hypertension: Secondary | ICD-10-CM | POA: Diagnosis not present

## 2021-08-11 DIAGNOSIS — F909 Attention-deficit hyperactivity disorder, unspecified type: Secondary | ICD-10-CM | POA: Diagnosis not present

## 2021-08-11 MED ORDER — AMPHETAMINE-DEXTROAMPHETAMINE 10 MG PO TABS: 10.0000 mg | ORAL_TABLET | Freq: Two times a day (BID) | ORAL | 0 refills | Status: DC

## 2021-08-11 NOTE — Progress Notes (Deleted)
      Established patient visit   Patient: Bruce Pittman   DOB: Jan 15, 1991   31 y.o. Male  MRN: [id number] Visit Date: 09/22/2021  Today's healthcare provider: [name], MD   No chief complaint on file.  Subjective    HPI  Was last seen 08/11/2021 and changed from 15mg  QAM to 10mg  BID Adderrall IR due to medication wearing off early in the afternoon. Had previously changed from 15mg  ER to 15mg  IR due to difficulty sleeping with the ER formulation  Medications: Outpatient Medications Prior to Visit  Medication Sig   amphetamine-dextroamphetamine (ADDERALL) 10 MG tablet Take 1 tablet (10 mg total) by mouth 2 (two) times daily.   cetirizine (ZYRTEC ALLERGY) 10 MG tablet Take 1 tablet (10 mg total) by mouth daily.   No facility-administered medications prior to visit.    Review of Systems  {Labs  Heme  Chem  Endocrine  Serology  Results Review (optional):23779}   Objective    There were no vitals taken for this visit. {Show previous vital signs (optional):23777}  Physical Exam  ***  No results found for any visits on 09/22/21.  Assessment & Plan     ***  No follow-ups on file.      {provider attestation***:1}   , MD  [facility] [phone] (phone) [phone] (fax)  [facility]

## 2021-09-11 ENCOUNTER — Other Ambulatory Visit: Payer: Self-pay | Admitting: Family Medicine

## 2021-09-11 DIAGNOSIS — F909 Attention-deficit hyperactivity disorder, unspecified type: Secondary | ICD-10-CM

## 2021-09-12 MED ORDER — AMPHETAMINE-DEXTROAMPHETAMINE 10 MG PO TABS: 10.0000 mg | ORAL_TABLET | Freq: Two times a day (BID) | ORAL | 0 refills | Status: DC

## 2021-09-22 ENCOUNTER — Ambulatory Visit: Payer: BC Managed Care – PPO | Admitting: Family Medicine

## 2021-09-22 DIAGNOSIS — F909 Attention-deficit hyperactivity disorder, unspecified type: Secondary | ICD-10-CM

## 2021-09-22 DIAGNOSIS — R030 Elevated blood-pressure reading, without diagnosis of hypertension: Secondary | ICD-10-CM

## 2021-10-09 ENCOUNTER — Other Ambulatory Visit: Payer: Self-pay | Admitting: Family Medicine

## 2021-10-09 DIAGNOSIS — F909 Attention-deficit hyperactivity disorder, unspecified type: Secondary | ICD-10-CM

## 2021-10-10 MED ORDER — AMPHETAMINE-DEXTROAMPHETAMINE 10 MG PO TABS: 10.0000 mg | ORAL_TABLET | Freq: Two times a day (BID) | ORAL | 0 refills | Status: DC

## 2021-10-10 NOTE — Telephone Encounter (Signed)
Last refill: 09/12/2021 #60 with 0 refills  Last office visit: 08/11/2021 No future appt scheduled

## 2021-11-08 ENCOUNTER — Other Ambulatory Visit: Payer: Self-pay | Admitting: Family Medicine

## 2021-11-08 DIAGNOSIS — F909 Attention-deficit hyperactivity disorder, unspecified type: Secondary | ICD-10-CM

## 2021-11-08 MED ORDER — AMPHETAMINE-DEXTROAMPHETAMINE 10 MG PO TABS: 10.0000 mg | ORAL_TABLET | Freq: Two times a day (BID) | ORAL | 0 refills | Status: DC

## 2021-12-05 ENCOUNTER — Other Ambulatory Visit: Payer: Self-pay | Admitting: Family Medicine

## 2021-12-05 DIAGNOSIS — F909 Attention-deficit hyperactivity disorder, unspecified type: Secondary | ICD-10-CM

## 2021-12-06 MED ORDER — AMPHETAMINE-DEXTROAMPHETAMINE 10 MG PO TABS: 10.0000 mg | ORAL_TABLET | Freq: Two times a day (BID) | ORAL | 0 refills | Status: DC

## 2022-01-02 ENCOUNTER — Other Ambulatory Visit: Payer: Self-pay | Admitting: Family Medicine

## 2022-01-02 DIAGNOSIS — F909 Attention-deficit hyperactivity disorder, unspecified type: Secondary | ICD-10-CM

## 2022-01-03 MED ORDER — AMPHETAMINE-DEXTROAMPHETAMINE 10 MG PO TABS: 10.0000 mg | ORAL_TABLET | Freq: Two times a day (BID) | ORAL | 0 refills | Status: DC

## 2022-01-16 ENCOUNTER — Other Ambulatory Visit: Payer: Self-pay | Admitting: Family Medicine

## 2022-01-16 DIAGNOSIS — F909 Attention-deficit hyperactivity disorder, unspecified type: Secondary | ICD-10-CM

## 2022-01-17 ENCOUNTER — Ambulatory Visit: Payer: BC Managed Care – PPO | Admitting: Family Medicine

## 2022-01-17 ENCOUNTER — Encounter: Payer: Self-pay | Admitting: Family Medicine

## 2022-01-17 VITALS — BP 127/81 | HR 96 | Resp 16 | Ht 79.0 in | Wt 180.0 lb

## 2022-01-17 DIAGNOSIS — R030 Elevated blood-pressure reading, without diagnosis of hypertension: Secondary | ICD-10-CM

## 2022-01-17 DIAGNOSIS — F909 Attention-deficit hyperactivity disorder, unspecified type: Secondary | ICD-10-CM | POA: Diagnosis not present

## 2022-01-17 MED ORDER — AMPHETAMINE-DEXTROAMPHETAMINE 10 MG PO TABS: 10.0000 mg | ORAL_TABLET | Freq: Two times a day (BID) | ORAL | 0 refills | Status: DC

## 2022-01-17 NOTE — Progress Notes (Signed)
     I,[name],acting as a scribe for [name], MD.,have documented all relevant documentation on the behalf of [name], MD,as directed by  [name], MD while in the presence of [name], MD.   Established patient visit   Patient: Bruce Pittman   DOB: [birth year]   31 y.o. Male  MRN: [id number] Visit Date: 01/17/2022  Today's healthcare provider: [name], MD    Subjective    HPI  Follow up for ADHD  The patient was last seen for this 5 months ago. Changes made at last visit include changed to 10mg  BID. He takes one every morning and usually another mid day depending on his work schedule. Is working as in [place] which he is doing well with and enjoying.  He reports excellent compliance with treatment. He feels that condition is Improved. He is not having side effects.   -----------------------------------------------------------------------------------------  Also here to follow up on high blood pressure which was 141/96 in June. He has been working on eating healthier, exercising and cutting back on chewing tobacco.   Medications: Outpatient Medications Prior to Visit  Medication Sig   amphetamine-dextroamphetamine (ADDERALL) 10 MG tablet Take 1 tablet (10 mg total) by mouth 2 (two) times daily.   cetirizine (ZYRTEC ALLERGY) 10 MG tablet Take 1 tablet (10 mg total) by mouth daily.   No facility-administered medications prior to visit.    Review of Systems     Objective    BP 127/81 (BP Location: Right Arm, Patient Position: Sitting, Cuff Size: Normal)   Pulse 96   Resp 16   Ht 6\' 7"  (2.007 m)   Wt 180 lb (81.6 kg)   SpO2 100%   BMI 20.28 kg/m  BP Readings from Last 3 Encounters:  01/17/22 127/81  08/11/21 (!) 141/96  07/31/21 131/80   Wt Readings from Last 3 Encounters:  01/17/22 180 lb (81.6 kg)  08/11/21 180 lb (81.6 kg)  05/29/21 181 lb (82.1 kg)    Physical Exam   General: Appearance:     Well developed, well nourished male in no acute distress  Eyes:    PERRL, conjunctiva/corneas clear, EOM's intact       Lungs:     Clear to auscultation bilaterally, respirations unlabored  Heart:    Normal heart rate. Normal rhythm. No murmurs, rubs, or gallops.    MS:   All extremities are intact.    Neurologic:   Awake, alert, oriented x 3. No apparent focal neurological defect.         Assessment & Plan      1. Attention deficit hyperactivity disorder (ADHD), unspecified ADHD type Doing well with current dose of  amphetamine-dextroamphetamine (ADDERALL) 10 MG tablet; Take 1 tablet (10 mg total) by mouth 2 (two) times daily.  Dispense: 60 tablet; Refill: 0  Is helping significantly with focus and attention, and completing work projects. Having no adverse effects from medication.   2. Elevated blood pressure reading Much better since last visit since he has been eating healthier and cutting back on chewing toboaccl.       The entirety of the information documented in the History of Present Illness, Review of Systems and Physical Exam were personally obtained by me. Portions of this information were initially documented by the CMA and reviewed by me for thoroughness and accuracy.     10/11/21, MD  [facility] [phone] (phone) [phone] (fax)  [facility]

## 2022-01-26 ENCOUNTER — Ambulatory Visit: Payer: BC Managed Care – PPO | Admitting: Family Medicine

## 2022-02-13 ENCOUNTER — Other Ambulatory Visit: Payer: Self-pay | Admitting: Family Medicine

## 2022-02-13 DIAGNOSIS — F909 Attention-deficit hyperactivity disorder, unspecified type: Secondary | ICD-10-CM

## 2022-02-14 MED ORDER — AMPHETAMINE-DEXTROAMPHETAMINE 10 MG PO TABS: 10.0000 mg | ORAL_TABLET | Freq: Two times a day (BID) | ORAL | 0 refills | Status: DC

## 2022-03-14 ENCOUNTER — Other Ambulatory Visit: Payer: Self-pay | Admitting: Family Medicine

## 2022-03-14 DIAGNOSIS — F909 Attention-deficit hyperactivity disorder, unspecified type: Secondary | ICD-10-CM

## 2022-03-15 MED ORDER — AMPHETAMINE-DEXTROAMPHETAMINE 10 MG PO TABS: 10.0000 mg | ORAL_TABLET | Freq: Two times a day (BID) | ORAL | 0 refills | Status: DC

## 2022-04-12 ENCOUNTER — Other Ambulatory Visit: Payer: Self-pay | Admitting: Family Medicine

## 2022-04-12 DIAGNOSIS — F909 Attention-deficit hyperactivity disorder, unspecified type: Secondary | ICD-10-CM

## 2022-04-12 NOTE — Telephone Encounter (Signed)
Please review. Last office visit 01/17/2022.  KP

## 2022-04-13 ENCOUNTER — Other Ambulatory Visit: Payer: Self-pay | Admitting: Family Medicine

## 2022-04-13 DIAGNOSIS — F909 Attention-deficit hyperactivity disorder, unspecified type: Secondary | ICD-10-CM

## 2022-04-13 MED ORDER — AMPHETAMINE-DEXTROAMPHETAMINE 10 MG PO TABS: 10.0000 mg | ORAL_TABLET | Freq: Two times a day (BID) | ORAL | 0 refills | Status: DC

## 2022-05-10 ENCOUNTER — Other Ambulatory Visit: Payer: Self-pay | Admitting: Family Medicine

## 2022-05-10 DIAGNOSIS — F909 Attention-deficit hyperactivity disorder, unspecified type: Secondary | ICD-10-CM

## 2022-05-10 MED ORDER — AMPHETAMINE-DEXTROAMPHETAMINE 10 MG PO TABS: 10.0000 mg | ORAL_TABLET | Freq: Two times a day (BID) | ORAL | 0 refills | Status: DC

## 2022-06-07 ENCOUNTER — Other Ambulatory Visit: Payer: Self-pay | Admitting: Family Medicine

## 2022-06-07 DIAGNOSIS — F909 Attention-deficit hyperactivity disorder, unspecified type: Secondary | ICD-10-CM

## 2022-06-08 MED ORDER — AMPHETAMINE-DEXTROAMPHETAMINE 10 MG PO TABS: 10.0000 mg | ORAL_TABLET | Freq: Two times a day (BID) | ORAL | 0 refills | Status: DC

## 2022-07-09 ENCOUNTER — Other Ambulatory Visit: Payer: Self-pay | Admitting: Family Medicine

## 2022-07-09 DIAGNOSIS — F909 Attention-deficit hyperactivity disorder, unspecified type: Secondary | ICD-10-CM

## 2022-07-09 MED ORDER — AMPHETAMINE-DEXTROAMPHETAMINE 10 MG PO TABS: 10.0000 mg | ORAL_TABLET | Freq: Two times a day (BID) | ORAL | 0 refills | Status: DC

## 2022-08-08 ENCOUNTER — Other Ambulatory Visit: Payer: Self-pay | Admitting: Family Medicine

## 2022-08-08 DIAGNOSIS — F909 Attention-deficit hyperactivity disorder, unspecified type: Secondary | ICD-10-CM

## 2022-08-09 ENCOUNTER — Other Ambulatory Visit: Payer: Self-pay | Admitting: Family Medicine

## 2022-08-09 MED ORDER — AMPHETAMINE-DEXTROAMPHETAMINE 10 MG PO TABS: 10.0000 mg | ORAL_TABLET | Freq: Two times a day (BID) | ORAL | 0 refills | Status: DC

## 2022-09-04 ENCOUNTER — Other Ambulatory Visit: Payer: Self-pay | Admitting: Family Medicine

## 2022-09-04 DIAGNOSIS — F909 Attention-deficit hyperactivity disorder, unspecified type: Secondary | ICD-10-CM

## 2022-09-06 ENCOUNTER — Other Ambulatory Visit: Payer: Self-pay | Admitting: Family Medicine

## 2022-09-06 DIAGNOSIS — F909 Attention-deficit hyperactivity disorder, unspecified type: Secondary | ICD-10-CM

## 2022-09-06 NOTE — Telephone Encounter (Signed)
Requested medication (s) are due for refill today - yes  Requested medication (s) are on the active medication list -yes  Future visit scheduled -yes  Last refill: 08/09/22 #60  Notes to clinic: refused- needs appointment- patient has scheduled appointment  Requested Prescriptions  Pending Prescriptions Disp Refills   amphetamine-dextroamphetamine (ADDERALL) 10 MG tablet 60 tablet 0    Sig: Take 1 tablet (10 mg total) by mouth 2 (two) times daily.     Not Delegated - Psychiatry:  Stimulants/ADHD Failed - 09/06/2022  2:30 PM      Failed - This refill cannot be delegated      Failed - Urine Drug Screen completed in last 360 days      Failed - Valid encounter within last 6 months    Recent Outpatient Visits           7 months ago Attention deficit hyperactivity disorder (ADHD), unspecified ADHD type   [facility] Malva Limes, MD   1 year ago Elevated blood pressure reading   [facility] Malva Limes, MD   1 year ago Attention deficit hyperactivity disorder (ADHD), unspecified ADHD type   [facility] Malva Limes, MD   1 year ago Tachycardia   [facility] Malva Limes, MD   1 year ago Attention deficit hyperactivity disorder (ADHD), unspecified ADHD type   [facility] Malva Limes, MD       Future Appointments             In 4 weeks Sherrie Mustache, Demetrios Isaacs, MD [facility]            Passed - Last BP in normal range    BP Readings from Last 1 Encounters:  01/17/22 127/81         Passed - Last Heart Rate in normal range    Pulse Readings from Last 1 Encounters:  01/17/22 96            Requested Prescriptions  Pending Prescriptions Disp Refills   amphetamine-dextroamphetamine (ADDERALL) 10 MG tablet 60 tablet 0    Sig: Take 1 tablet (10 mg total) by mouth 2 (two) times daily.      Not Delegated - Psychiatry:  Stimulants/ADHD Failed - 09/06/2022  2:30 PM      Failed - This refill cannot be delegated      Failed - Urine Drug Screen completed in last 360 days      Failed - Valid encounter within last 6 months    Recent Outpatient Visits           7 months ago Attention deficit hyperactivity disorder (ADHD), unspecified ADHD type   [facility] Malva Limes, MD   1 year ago Elevated blood pressure reading   [facility] Malva Limes, MD   1 year ago Attention deficit hyperactivity disorder (ADHD), unspecified ADHD type   [facility] Malva Limes, MD   1 year ago Tachycardia   [facility] Malva Limes, MD   1 year ago Attention deficit hyperactivity disorder (ADHD), unspecified ADHD type   [facility] Malva Limes, MD       Future Appointments             In 4 weeks Sherrie Mustache, Demetrios Isaacs, MD [facility]            Passed - Last BP in normal range    BP Readings from Last 1 Encounters:  01/17/22 127/81         Passed - Last Heart Rate in normal range    Pulse Readings from Last 1 Encounters:  01/17/22 96

## 2022-09-06 NOTE — Telephone Encounter (Signed)
Medication Refill - Medication: amphetamine-dextroamphetamine (ADDERALL) 10 MG tablet  Has the patient contacted their pharmacy? Yes.   Patient was advised to contact PCP  Preferred Pharmacy (with phone number or street name): [facility] [phone] [place], [place] - [address]  Phone: [phone] Fax: [phone]  Has the patient been seen for an appointment in the last year OR does the patient have an upcoming appointment? Yes.   Patient scheduled a F/U with PCP on 10/05/2022 and requested a refill of medication up until his appointment date only.  Patients contact [phone]

## 2022-09-10 MED ORDER — AMPHETAMINE-DEXTROAMPHETAMINE 10 MG PO TABS: 10.0000 mg | ORAL_TABLET | Freq: Two times a day (BID) | ORAL | 0 refills | Status: DC

## 2022-10-05 ENCOUNTER — Ambulatory Visit: Payer: BC Managed Care – PPO | Admitting: Family Medicine

## 2022-10-05 ENCOUNTER — Encounter: Payer: Self-pay | Admitting: Family Medicine

## 2022-10-05 VITALS — BP 139/82 | HR 101 | Temp 98.3°F | Ht 79.02 in | Wt 185.3 lb

## 2022-10-05 DIAGNOSIS — R000 Tachycardia, unspecified: Secondary | ICD-10-CM

## 2022-10-05 DIAGNOSIS — F909 Attention-deficit hyperactivity disorder, unspecified type: Secondary | ICD-10-CM

## 2022-10-05 DIAGNOSIS — F17229 Nicotine dependence, chewing tobacco, with unspecified nicotine-induced disorders: Secondary | ICD-10-CM

## 2022-10-05 DIAGNOSIS — Z136 Encounter for screening for cardiovascular disorders: Secondary | ICD-10-CM

## 2022-10-05 MED ORDER — AMPHETAMINE-DEXTROAMPHETAMINE 10 MG PO TABS: 10.0000 mg | ORAL_TABLET | Freq: Two times a day (BID) | ORAL | 0 refills | Status: DC

## 2022-10-05 NOTE — Progress Notes (Signed)
Established patient visit   Patient: Bruce Pittman   DOB: June 28, 1990   32 y.o. Male  MRN: [id number] Visit Date: 10/05/2022  Today's healthcare provider: [name], MD   Chief Complaint  Patient presents with   ADHD   Subjective    Discussed the use of AI scribe software for clinical note transcription with the patient, who gave verbal consent to proceed.  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 He reports that the medication has been effective in helping him stay focused at work. The patient takes the medication daily, splitting the dose to extend its effects throughout the day. He denies any adverse effects such as insomnia or cardiac symptoms.  The patient also reports using smokeless tobacco, consuming about a can a day. He has attempted to quit in the past and expresses a desire to reduce his intake due to the stimulant effects of nicotine, which may interact with his Adderall.  The patient denies any family history of cardiac disease. He also reports occasional consumption of coffee and energy drinks, but denies any adverse effects from these. The patient's heart rate has been noted to be slightly elevated, which may be due to the combined effects of Adderall, nicotine, and caffeine.       Medications: Outpatient Medications Prior to Visit  Medication Sig   [DISCONTINUED] amphetamine-dextroamphetamine (ADDERALL) 10 MG tablet Take 1 tablet (10 mg total) by mouth 2 (two) times daily.   No facility-administered medications prior to visit.   Review of Systems  Constitutional:  Negative for appetite change, chills and fever.  Respiratory:  Negative for chest tightness, shortness of breath and wheezing.   Cardiovascular:  Negative for chest pain and palpitations.  Gastrointestinal:  Negative for abdominal pain, nausea and vomiting.      Objective    BP 139/82   Pulse  (!) 101   Temp 98.3 F (36.8 C) (Oral)   Ht 6' 7.02" (2.007 m)   Wt 185 lb 4.8 oz (84.1 kg)   SpO2 100%   BMI 20.87 kg/m     Physical Exam   General: Appearance:    Well developed, well nourished male in no acute distress  Eyes:    PERRL, conjunctiva/corneas clear, EOM's intact       Lungs:     Clear to auscultation bilaterally, respirations unlabored  Heart:    Tachycardic. Normal rhythm. No murmurs, rubs, or gallops.    MS:   All extremities are intact.    Neurologic:   Awake, alert, oriented x 3. No apparent focal neurological defect.         Assessment & Plan     Assessment and Plan    Attention Deficit Hyperactivity Disorder (ADHD): Stable on Adderall 10mg  daily. Reports improved focus at work. No adverse effects reported. -Continue Adderall 10mg  daily. -[facility] [place]  Tachycardia: Heart rate consistently in the 90-100 range. Likely secondary to stimulant use (Adderall, nicotine, caffeine). No family history of heart disease. -Minimize intake of nicotine and caffeine. -Continue monitoring heart rate.  Smokeless Tobacco Use: Consumes about one can daily. Aware of the risks of oral cancer and the stimulant effect of nicotine. -Encouraged to minimize and eventually cease tobacco use.  Routine Health Maintenance: Due for routine blood work (glucose, kidney function, cholesterol). -Order labs. -Patient to complete labs at his convenience on a weekday.  [name], MD  [facility] [phone] (phone) [phone] (fax)  [facility]

## 2022-11-05 ENCOUNTER — Other Ambulatory Visit: Payer: Self-pay | Admitting: Family Medicine

## 2022-11-05 DIAGNOSIS — F909 Attention-deficit hyperactivity disorder, unspecified type: Secondary | ICD-10-CM

## 2022-11-06 MED ORDER — AMPHETAMINE-DEXTROAMPHETAMINE 10 MG PO TABS: 10.0000 mg | ORAL_TABLET | Freq: Two times a day (BID) | ORAL | 0 refills | Status: DC

## 2022-12-04 ENCOUNTER — Other Ambulatory Visit: Payer: Self-pay | Admitting: Family Medicine

## 2022-12-04 DIAGNOSIS — F909 Attention-deficit hyperactivity disorder, unspecified type: Secondary | ICD-10-CM

## 2022-12-06 ENCOUNTER — Other Ambulatory Visit: Payer: Self-pay | Admitting: Family Medicine

## 2022-12-06 DIAGNOSIS — F909 Attention-deficit hyperactivity disorder, unspecified type: Secondary | ICD-10-CM

## 2022-12-10 ENCOUNTER — Other Ambulatory Visit: Payer: Self-pay | Admitting: Family Medicine

## 2022-12-10 DIAGNOSIS — F909 Attention-deficit hyperactivity disorder, unspecified type: Secondary | ICD-10-CM

## 2022-12-10 MED ORDER — AMPHETAMINE-DEXTROAMPHETAMINE 10 MG PO TABS
10.0000 mg | ORAL_TABLET | Freq: Two times a day (BID) | ORAL | 0 refills | Status: DC
Start: 2022-12-10 — End: 2023-01-08

## 2023-01-08 ENCOUNTER — Other Ambulatory Visit: Payer: Self-pay | Admitting: Family Medicine

## 2023-01-08 DIAGNOSIS — F909 Attention-deficit hyperactivity disorder, unspecified type: Secondary | ICD-10-CM

## 2023-01-09 MED ORDER — AMPHETAMINE-DEXTROAMPHETAMINE 10 MG PO TABS
10.0000 mg | ORAL_TABLET | Freq: Two times a day (BID) | ORAL | 0 refills | Status: DC
Start: 2023-01-09 — End: 2023-02-06

## 2023-02-06 ENCOUNTER — Other Ambulatory Visit: Payer: Self-pay | Admitting: Family Medicine

## 2023-02-06 DIAGNOSIS — F909 Attention-deficit hyperactivity disorder, unspecified type: Secondary | ICD-10-CM

## 2023-02-08 MED ORDER — AMPHETAMINE-DEXTROAMPHETAMINE 10 MG PO TABS
10.0000 mg | ORAL_TABLET | Freq: Two times a day (BID) | ORAL | 0 refills | Status: DC
Start: 2023-02-08 — End: 2023-03-08

## 2023-03-08 ENCOUNTER — Other Ambulatory Visit: Payer: Self-pay | Admitting: Family Medicine

## 2023-03-08 DIAGNOSIS — F909 Attention-deficit hyperactivity disorder, unspecified type: Secondary | ICD-10-CM

## 2023-03-08 MED ORDER — AMPHETAMINE-DEXTROAMPHETAMINE 10 MG PO TABS
10.0000 mg | ORAL_TABLET | Freq: Two times a day (BID) | ORAL | 0 refills | Status: DC
Start: 2023-03-08 — End: 2023-04-05

## 2023-04-05 ENCOUNTER — Other Ambulatory Visit: Payer: Self-pay | Admitting: Family Medicine

## 2023-04-05 DIAGNOSIS — F909 Attention-deficit hyperactivity disorder, unspecified type: Secondary | ICD-10-CM

## 2023-04-06 MED ORDER — AMPHETAMINE-DEXTROAMPHETAMINE 10 MG PO TABS
10.0000 mg | ORAL_TABLET | Freq: Two times a day (BID) | ORAL | 0 refills | Status: DC
Start: 2023-04-06 — End: 2023-05-06

## 2023-04-08 ENCOUNTER — Ambulatory Visit: Payer: BC Managed Care – PPO | Admitting: Family Medicine

## 2023-04-09 ENCOUNTER — Ambulatory Visit: Payer: BC Managed Care – PPO | Admitting: Family Medicine

## 2023-04-09 ENCOUNTER — Encounter: Payer: Self-pay | Admitting: Family Medicine

## 2023-04-09 VITALS — BP 140/88 | HR 113 | Resp 16 | Ht 79.0 in | Wt 184.1 lb

## 2023-04-09 DIAGNOSIS — R030 Elevated blood-pressure reading, without diagnosis of hypertension: Secondary | ICD-10-CM

## 2023-04-09 DIAGNOSIS — F909 Attention-deficit hyperactivity disorder, unspecified type: Secondary | ICD-10-CM | POA: Diagnosis not present

## 2023-04-09 NOTE — Progress Notes (Signed)
      Established patient visit   Patient: Bruce Pittman   DOB: 01/23/1991   33 y.o. Male  MRN: [id number] Visit Date: 04/09/2023  Today's healthcare provider: [name], MD   Chief Complaint  Patient presents with   Medical Management of Chronic Issues    Follow-up ADHD   Subjective    HPI Follow up ADD. Doing well on current dose of Adderrall. Takes every weekday and usually on weekends. Helps concentration and staying focused on work projects. Occasionally poor appetite. No CP, palpitations, or sleep disturbances.   Medications: Outpatient Medications Prior to Visit  Medication Sig   amphetamine-dextroamphetamine (ADDERALL) 10 MG tablet Take 1 tablet (10 mg total) by mouth 2 (two) times daily.   No facility-administered medications prior to visit.    Review of Systems  Constitutional:  Negative for appetite change, chills and fever.  Respiratory:  Negative for chest tightness, shortness of breath and wheezing.   Cardiovascular:  Negative for chest pain and palpitations.  Gastrointestinal:  Negative for abdominal pain, nausea and vomiting.       Objective    BP (!) 140/88   Pulse (!) 113   Resp 16   Ht 6\' 7"  (2.007 m)   Wt 184 lb 1.6 oz (83.5 kg)   BMI 20.74 kg/m    Physical Exam   General: Appearance:    Well developed, well nourished male in no acute distress. Nervous appearing.   Eyes:    PERRL, conjunctiva/corneas clear, EOM's intact       Lungs:     Clear to auscultation bilaterally, respirations unlabored  Heart:    Tachycardic. Normal rhythm. No murmurs, rubs, or gallops.    MS:   All extremities are intact.    Neurologic:   Awake, alert, oriented x 3. No apparent focal neurological defect.         Assessment & Plan    1. Attention deficit hyperactivity disorder (ADHD), unspecified ADHD type (Primary)  Doing well on current dose Adderall. Continue current medications.    2. Elevated blood pressure reading Likely white coat syndrome.  -  Comprehensive metabolic panel - Lipid panel         [name], MD  [facility] [phone] (phone) [phone] (fax)  [facility]

## 2023-05-06 ENCOUNTER — Other Ambulatory Visit: Payer: Self-pay | Admitting: Family Medicine

## 2023-05-06 DIAGNOSIS — F909 Attention-deficit hyperactivity disorder, unspecified type: Secondary | ICD-10-CM

## 2023-05-07 MED ORDER — AMPHETAMINE-DEXTROAMPHETAMINE 10 MG PO TABS
10.0000 mg | ORAL_TABLET | Freq: Two times a day (BID) | ORAL | 0 refills | Status: DC
Start: 2023-05-07 — End: 2023-06-03

## 2023-06-03 ENCOUNTER — Other Ambulatory Visit: Payer: Self-pay | Admitting: Family Medicine

## 2023-06-03 DIAGNOSIS — F909 Attention-deficit hyperactivity disorder, unspecified type: Secondary | ICD-10-CM

## 2023-06-04 MED ORDER — AMPHETAMINE-DEXTROAMPHETAMINE 10 MG PO TABS
10.0000 mg | ORAL_TABLET | Freq: Two times a day (BID) | ORAL | 0 refills | Status: DC
Start: 2023-06-04 — End: 2023-07-01

## 2023-07-01 ENCOUNTER — Other Ambulatory Visit: Payer: Self-pay | Admitting: Family Medicine

## 2023-07-01 DIAGNOSIS — F909 Attention-deficit hyperactivity disorder, unspecified type: Secondary | ICD-10-CM

## 2023-07-02 MED ORDER — AMPHETAMINE-DEXTROAMPHETAMINE 10 MG PO TABS: 10.0000 mg | ORAL_TABLET | Freq: Two times a day (BID) | ORAL | 0 refills | Status: DC

## 2023-08-01 ENCOUNTER — Other Ambulatory Visit: Payer: Self-pay | Admitting: Family Medicine

## 2023-08-01 DIAGNOSIS — F909 Attention-deficit hyperactivity disorder, unspecified type: Secondary | ICD-10-CM

## 2023-08-01 NOTE — Telephone Encounter (Signed)
 Copied from CRM [id number]. Topic: Clinical - Medication Refill >> Aug 01, 2023  3:08 PM Star East wrote: Medication: amphetamine -dextroamphetamine  (ADDERALL) 10 MG tablet  Has the patient contacted their pharmacy? No (Agent: If no, request that the patient contact the pharmacy for the refill. If patient does not wish to contact the pharmacy document the reason why and proceed with request.) (Agent: If yes, when and what did the pharmacy advise?)  This is the patient's preferred pharmacy:    [facility] - [place], [place] - [address] [place] [address] Phone: [phone] Fax: [phone]  Is this the correct pharmacy for this prescription? Yes If no, delete pharmacy and type the correct one.   Has the prescription been filled recently? Yes  Is the patient out of the medication? Yes  Has the patient been seen for an appointment in the last year OR does the patient have an upcoming appointment? Yes  Can we respond through MyChart? Yes  Agent: Please be advised that Rx refills may take up to 3 business days. We ask that you follow-up with your pharmacy.

## 2023-08-02 ENCOUNTER — Other Ambulatory Visit: Payer: Self-pay | Admitting: Family Medicine

## 2023-08-02 DIAGNOSIS — F909 Attention-deficit hyperactivity disorder, unspecified type: Secondary | ICD-10-CM

## 2023-08-02 MED ORDER — AMPHETAMINE-DEXTROAMPHETAMINE 10 MG PO TABS: 10.0000 mg | ORAL_TABLET | Freq: Two times a day (BID) | ORAL | 0 refills | Status: DC

## 2023-08-02 NOTE — Telephone Encounter (Signed)
 Requested medication (s) are due for refill today: yes  Requested medication (s) are on the active medication list: yes  Last refill:  07/02/23  Future visit scheduled: no  Notes to clinic:  Unable to refill per protocol, cannot delegate.      Requested Prescriptions  Pending Prescriptions Disp Refills   amphetamine -dextroamphetamine  (ADDERALL) 10 MG tablet 60 tablet 0    Sig: Take 1 tablet (10 mg total) by mouth 2 (two) times daily.     Not Delegated - Psychiatry:  Stimulants/ADHD Failed - 08/02/2023  3:51 PM      Failed - This refill cannot be delegated      Failed - Urine Drug Screen completed in last 360 days      Failed - Last BP in normal range    BP Readings from Last 1 Encounters:  04/09/23 (!) 140/88         Failed - Last Heart Rate in normal range    Pulse Readings from Last 1 Encounters:  04/09/23 (!) 113         Failed - Valid encounter within last 6 months    Recent Outpatient Visits   None

## 2023-08-30 NOTE — Telephone Encounter (Unsigned)
 Copied from CRM [id number]. Topic: Clinical - Medication Refill >> Aug 30, 2023  2:10 PM Lizabeth Riggs wrote: Medication: amphetamine -dextroamphetamine  (ADDERALL) 10 MG tablet   Has the patient contacted their pharmacy? Yes (Agent: If no, request that the patient contact the pharmacy for the refill. If patient does not wish to contact the pharmacy document the reason why and proceed with request.) (Agent: If yes, when and what did the pharmacy advise?) Pharmacy needs order to refill  This is the patient's preferred pharmacy:  [facility] - [place], [place] - [address] [place] [address] Phone: [phone] Fax: [phone]  Is this the correct pharmacy for this prescription? Yes If no, delete pharmacy and type the correct one.   Has the prescription been filled recently? No  Is the patient out of the medication? No - 1 day left to take medication  Has the patient been seen for an appointment in the last year OR does the patient have an upcoming appointment? Yes- July 18 at 11:40 AM  Can we respond through MyChart? Yes  Agent: Please be advised that Rx refills may take up to 3 business days. We ask that you follow-up with your pharmacy.

## 2023-09-25 DIAGNOSIS — Z136 Encounter for screening for cardiovascular disorders: Secondary | ICD-10-CM | POA: Diagnosis not present

## 2023-09-25 DIAGNOSIS — F909 Attention-deficit hyperactivity disorder, unspecified type: Secondary | ICD-10-CM | POA: Diagnosis not present

## 2023-09-26 ENCOUNTER — Ambulatory Visit: Payer: Self-pay | Admitting: Family Medicine

## 2023-09-26 LAB — COMPREHENSIVE METABOLIC PANEL WITH GFR
ALT: 11 IU/L (ref 0–44)
AST: 30 IU/L (ref 0–40)
Albumin: 5.1 g/dL (ref 4.1–5.1)
Alkaline Phosphatase: 58 IU/L (ref 44–121)
BUN/Creatinine Ratio: 9 (ref 9–20)
BUN: 9 mg/dL (ref 6–20)
Bilirubin Total: 0.9 mg/dL (ref 0.0–1.2)
CO2: 22 mmol/L (ref 20–29)
Calcium: 9.9 mg/dL (ref 8.7–10.2)
Chloride: 99 mmol/L (ref 96–106)
Creatinine, Ser: 0.98 mg/dL (ref 0.76–1.27)
Globulin, Total: 2.4 g/dL (ref 1.5–4.5)
Glucose: 90 mg/dL (ref 70–99)
Potassium: 4.1 mmol/L (ref 3.5–5.2)
Sodium: 137 mmol/L (ref 134–144)
Total Protein: 7.5 g/dL (ref 6.0–8.5)
eGFR: 106 mL/min/1.73 (ref >59)

## 2023-09-26 LAB — LIPID PANEL
Chol/HDL Ratio: 2.5 ratio (ref 0.0–5.0)
Cholesterol, Total: 221 mg/dL — ABNORMAL HIGH (ref 100–199)
HDL: 87 mg/dL (ref >39)
LDL Chol Calc (NIH): 120 mg/dL — ABNORMAL HIGH (ref 0–99)
Triglycerides: 82 mg/dL (ref 0–149)
VLDL Cholesterol Cal: 14 mg/dL (ref 5–40)

## 2023-09-27 ENCOUNTER — Encounter: Payer: Self-pay | Admitting: Family Medicine

## 2023-09-27 ENCOUNTER — Ambulatory Visit (INDEPENDENT_AMBULATORY_CARE_PROVIDER_SITE_OTHER): Admitting: Family Medicine

## 2023-09-27 VITALS — BP 145/88 | HR 113 | Resp 16 | Ht 79.0 in | Wt 180.3 lb

## 2023-09-27 DIAGNOSIS — F909 Attention-deficit hyperactivity disorder, unspecified type: Secondary | ICD-10-CM

## 2023-09-27 DIAGNOSIS — R030 Elevated blood-pressure reading, without diagnosis of hypertension: Secondary | ICD-10-CM

## 2023-09-27 DIAGNOSIS — Z0000 Encounter for general adult medical examination without abnormal findings: Secondary | ICD-10-CM | POA: Diagnosis not present

## 2023-09-27 MED ORDER — AMPHETAMINE-DEXTROAMPHETAMINE 10 MG PO TABS: 10.0000 mg | ORAL_TABLET | Freq: Two times a day (BID) | ORAL | 0 refills | Status: DC

## 2023-09-27 NOTE — Patient Instructions (Addendum)
Please review the attached list of medications and notify my office if there are any errors.   You are due for a Tdap (tetanus-diptheria-pertussis vaccine) which protects you from tetanus and whooping cough. Please check with your insurance plan or pharmacy regarding coverage for this vaccine.   

## 2023-09-29 NOTE — Progress Notes (Signed)
 Complete physical exam   Patient: Bruce Pittman   DOB: 04-Sep-1990   33 y.o. Male  MRN: [id number] Visit Date: 09/27/2023  Today's healthcare provider: Nancyann Perry, MD   Chief Complaint  Patient presents with   Annual Exam   Subjective    Discussed the use of AI scribe software for clinical note transcription with the patient, who gave verbal consent to proceed.  History of Present Illness   Bruce Pittman is a 33 year old male who presents for an annual physical exam and ADD follow up.  He has been out of Adderall for a couple of weeks due to not having an appointment. Prior to this, he found the medication helpful and has not experienced any side effects. 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  He has a family history of high blood pressure, with his mother, brother, and father all affected. He acknowledges consuming too much fast food, which is high in sodium, but states that he eats 'pretty clean' for dinner, including vegetables and protein. He does not regularly check his blood pressure and has no regular exercise routine.  He is married and has a child who will be [age] years old in December. No issues with stomach or bowel movements.     BP Readings from Last 3 Encounters:  09/27/23 (!) 145/88  04/09/23 (!) 140/88  10/05/22 139/82     Past Medical History:  Diagnosis Date   Anemia    Anxiety    History reviewed. No pertinent surgical history. Social History   Socioeconomic History   Marital status: Married    Spouse name: Not on file   Number of children: Not on file   Years of education: Not on file   Highest education level: Not on file  Occupational History   Occupation: Risk analyst  Tobacco Use   Smoking status: Never   Smokeless tobacco: Current  Vaping Use   Vaping status: Never Used  Substance and Sexual Activity   Alcohol use: Yes   Drug use:  Never   Sexual activity: Not on file  Other Topics Concern   Not on file  Social History Narrative   Not on file   Social Drivers of Health   Financial Resource Strain: Not on file  Food Insecurity: Not on file  Transportation Needs: Not on file  Physical Activity: Not on file  Stress: Not on file  Social Connections: Not on file  Intimate Partner Violence: Not on file   No family status information on file.   History reviewed. No pertinent family history. No Known Allergies  Patient Care Team: Perry Nancyann BRAVO, MD as PCP - General (Family Medicine)   Medications: Outpatient Medications Prior to Visit  Medication Sig   amphetamine -dextroamphetamine  (ADDERALL) 10 MG tablet Take 1 tablet (10 mg total) by mouth 2 (two) times daily. (Not taking)   No facility-administered medications prior to visit.     Objective    BP (!) 145/88 (BP Location: Left Arm, Patient Position: Sitting, Cuff Size: Normal)   Pulse (!) 113   Resp 16   Ht 6' 7 (2.007 m)   Wt 180 lb 4.8 oz (81.8 kg)   SpO2 100%   BMI 20.31 kg/m    Physical Exam   General Appearance:    Well developed, well nourished male. Alert, cooperative, in no acute distress, appears stated age  Head:  Normocephalic, without obvious abnormality, atraumatic  Eyes:    PERRL, conjunctiva/corneas clear, EOM's intact, fundi    benign, both eyes       Ears:    Normal TM's and external ear canals, both ears  Nose:   Nares normal, septum midline, mucosa normal, no drainage   or sinus tenderness  Throat:   Lips, mucosa, and tongue normal; teeth and gums normal  Neck:   Supple, symmetrical, trachea midline, no adenopathy;       thyroid :  No enlargement/tenderness/nodules; no carotid   bruit or JVD  Back:     Symmetric, no curvature, ROM normal, no CVA tenderness  Lungs:     Clear to auscultation bilaterally, respirations unlabored  Chest wall:    No tenderness or deformity  Heart:    Tachycardic. Normal rhythm. No murmurs,  rubs, or gallops.  S1 and S2 normal  Abdomen:     Soft, non-tender, bowel sounds active all four quadrants,    no masses, no organomegaly  Genitalia:    deferred  Rectal:    deferred  Extremities:   All extremities are intact. No cyanosis or edema  Pulses:   2+ and symmetric all extremities  Skin:   Skin color, texture, turgor normal, no rashes or lesions  Lymph nodes:   Cervical, supraclavicular, and axillary nodes normal  Neurologic:   CNII-XII intact. Normal strength, sensation and reflexes      throughout     Last depression screening scores    09/27/2023   11:28 AM 04/09/2023   11:26 AM 01/17/2022    9:01 AM  PHQ 2/9 Scores  PHQ - 2 Score 0 0 0  PHQ- 9 Score 5  3   Last fall risk screening    09/27/2023   11:28 AM  Fall Risk   Falls in the past year? 0  Number falls in past yr: 0  Injury with Fall? 0  Risk for fall due to : No Fall Risks   Last Audit-C alcohol use screening    01/17/2022    9:02 AM  Alcohol Use Disorder Test (AUDIT)  1. How often do you have a drink containing alcohol? 2  2. How many drinks containing alcohol do you have on a typical day when you are drinking? 0  3. How often do you have six or more drinks on one occasion? 1  AUDIT-C Score 3   A score of 3 or more in women, and 4 or more in men indicates increased risk for alcohol abuse, EXCEPT if all of the points are from question 1   No results found for any visits on 09/27/23.  Assessment & Plan    Routine Health Maintenance and Physical Exam  Exercise Activities and Dietary recommendations  Goals   None     Immunization History  Administered Date(s) Administered   Dtap, Unspecified 06/01/1992, 08/19/1992, 10/19/1992, 07/11/1993, 08/04/1999   HIB, Unspecified 06/01/1992, 08/19/1992, 10/19/1992, 07/11/1993   Hep B, Unspecified 03/21/92, 06/01/1992, 10/19/1992   MMR 07/11/1993, 08/04/1999   Polio, Unspecified 06/01/1992, 08/19/1992, 10/19/1992, 08/04/1999   Td (Adult),5 Lf Tetanus  Toxid, Preservative Free 12/26/2004    Health Maintenance  Topic Date Due   DTaP/Tdap/Td (6 - Tdap) 12/27/2004   HIV Screening  Never done   Hepatitis C Screening  Never done   HPV VACCINES (1 - 3-dose SCDM series) Never done   COVID-19 Vaccine (1 - 2024-25 season) Never done   INFLUENZA VACCINE  10/11/2023   Meningococcal B Vaccine  Aged Out   Hepatitis B Vaccines  Discontinued    Discussed health benefits of physical activity, and encouraged him to engage in regular exercise appropriate for his age and condition.     Blood pressure elevated. Family history of hypertension. High sodium intake from fast food. Some component of white coat hypertension - Advise reducing sodium intake, particularly from fast food. - Schedule follow-up appointment to recheck blood pressure at the end of the year.  Attention Deficit Hyperactivity Disorder (ADHD) ADHD well-managed with Adderall. No side effects reported. Positive outcomes noted. Start back on previous dose of Adderall  General Health Maintenance Overdue for tetanus booster. Increased importance due to young child and pertussis risk. - Recommend tetanus booster vaccination. - Provide information on obtaining the vaccine from a pharmacy if preferred.       Return in about 4 months (around 01/28/2024) for blood pressure and ADD.        Nancyann Perry, MD  [facility] [phone] (phone) [phone] (fax)  [facility]

## 2023-10-24 ENCOUNTER — Other Ambulatory Visit: Payer: Self-pay | Admitting: Family Medicine

## 2023-10-24 DIAGNOSIS — F909 Attention-deficit hyperactivity disorder, unspecified type: Secondary | ICD-10-CM

## 2023-10-24 NOTE — Telephone Encounter (Unsigned)
 Copied from CRM #[id number]. Topic: Clinical - Medication Refill >> Oct 24, 2023 12:44 PM Zebedee SAUNDERS wrote: Medication: amphetamine-dextroamphetamine (ADDERALL) 10 MG tablet  Has the patient contacted their pharmacy? Yes (Agent: If no, request that the patient contact the pharmacy for the refill. If patient does not wish to contact the pharmacy document the reason why and proceed with request.) (Agent: If yes, when and what did the pharmacy advise?)Pharmacy need PCP approval  This is the patient's preferred pharmacy:  [phone] [place] - [address] [place] [address] Phone: [phone] Fax: [phone]  Is this the correct pharmacy for this prescription? Yes If no, delete pharmacy and type the correct one.   Has the prescription been filled recently? Yes  Is the patient out of the medication? Yes  Has the patient been seen for an appointment in the last year OR does the patient have an upcoming appointment? Yes  Can we respond through MyChart? Yes  Agent: Please be advised that Rx refills may take up to 3 business days. We ask that you follow-up with your pharmacy.

## 2023-10-28 NOTE — Telephone Encounter (Signed)
 Requested medication (s) are due for refill today: yes  Requested medication (s) are on the active medication list: yes  Last refill:  09/27/23  Future visit scheduled: no  Notes to clinic:  Unable to refill per protocol, cannot delegate.      Requested Prescriptions  Pending Prescriptions Disp Refills   amphetamine -dextroamphetamine  (ADDERALL) 10 MG tablet 60 tablet 0    Sig: Take 1 tablet (10 mg total) by mouth 2 (two) times daily.     Not Delegated - Psychiatry:  Stimulants/ADHD Failed - 10/28/2023  2:04 PM      Failed - This refill cannot be delegated      Failed - Urine Drug Screen completed in last 360 days      Failed - Last BP in normal range    BP Readings from Last 1 Encounters:  09/27/23 (!) 145/88         Failed - Last Heart Rate in normal range    Pulse Readings from Last 1 Encounters:  09/27/23 (!) 113         Passed - Valid encounter within last 6 months    Recent Outpatient Visits           1 month ago Annual physical exam   [facility] Gasper, Nancyann BRAVO, MD

## 2023-10-29 MED ORDER — AMPHETAMINE-DEXTROAMPHETAMINE 10 MG PO TABS: 10.0000 mg | ORAL_TABLET | Freq: Two times a day (BID) | ORAL | 0 refills | Status: DC

## 2023-11-25 ENCOUNTER — Other Ambulatory Visit: Payer: Self-pay | Admitting: Family Medicine

## 2023-11-25 DIAGNOSIS — F909 Attention-deficit hyperactivity disorder, unspecified type: Secondary | ICD-10-CM

## 2023-11-25 NOTE — Telephone Encounter (Unsigned)
 Copied from CRM [id number]. Topic: Clinical - Medication Refill >> Nov 25, 2023  2:40 PM Shardie S wrote: Medication: amphetamine -dextroamphetamine  (ADDERALL) 10 MG tablet  Has the patient contacted their pharmacy? Yes (Agent: If no, request that the patient contact the pharmacy for the refill. If patient does not wish to contact the pharmacy document the reason why and proceed with request.) (Agent: If yes, when and what did the pharmacy advise?)  This is the patient's preferred pharmacy:  [facility] [phone] [facility] [place] - [address] [place] [address] Phone: [phone] Fax: [phone]   Is this the correct pharmacy for this prescription? Yes If no, delete pharmacy and type the correct one.   Has the prescription been filled recently? No  Is the patient out of the medication? Yes  Has the patient been seen for an appointment in the last year OR does the patient have an upcoming appointment? Yes  Can we respond through MyChart? Yes  Agent: Please be advised that Rx refills may take up to 3 business days. We ask that you follow-up with your pharmacy.

## 2023-11-27 MED ORDER — AMPHETAMINE-DEXTROAMPHETAMINE 10 MG PO TABS: 10.0000 mg | ORAL_TABLET | Freq: Two times a day (BID) | ORAL | 0 refills | Status: DC

## 2023-11-27 NOTE — Telephone Encounter (Signed)
 Requested medication (s) are due for refill today: yes  Requested medication (s) are on the active medication list: yes  Last refill:  10/29/23  Future visit scheduled: yes  Notes to clinic:  Unable to refill per protocol, cannot delegate.      Requested Prescriptions  Pending Prescriptions Disp Refills   amphetamine -dextroamphetamine  (ADDERALL) 10 MG tablet 60 tablet 0    Sig: Take 1 tablet (10 mg total) by mouth 2 (two) times daily.     Not Delegated - Psychiatry:  Stimulants/ADHD Failed - 11/27/2023  8:25 AM      Failed - This refill cannot be delegated      Failed - Urine Drug Screen completed in last 360 days      Failed - Last BP in normal range    BP Readings from Last 1 Encounters:  09/27/23 (!) 145/88         Failed - Last Heart Rate in normal range    Pulse Readings from Last 1 Encounters:  09/27/23 (!) 113         Passed - Valid encounter within last 6 months    Recent Outpatient Visits           2 months ago Annual physical exam   [facility] Gasper, Nancyann BRAVO, MD

## 2023-12-23 ENCOUNTER — Other Ambulatory Visit: Payer: Self-pay | Admitting: Family Medicine

## 2023-12-23 DIAGNOSIS — F909 Attention-deficit hyperactivity disorder, unspecified type: Secondary | ICD-10-CM

## 2023-12-23 NOTE — Telephone Encounter (Unsigned)
 Copied from CRM [id number]. Topic: Clinical - Medication Refill >> Dec 23, 2023  3:40 PM Montie POUR wrote: Medication:  amphetamine -dextroamphetamine  (ADDERALL) 10 MG tablet   Has the patient contacted their pharmacy? Yes (Agent: If no, request that the patient contact the pharmacy for the refill. If patient does not wish to contact the pharmacy document the reason why and proceed with request.) (Agent: If yes, when and what did the pharmacy advise?) Pharmacy needs order to refill  This is the patient's preferred pharmacy:  [phone] [facility] [place] - [address] [place] [address] Phone: [phone] Fax: [phone]  Is this the correct pharmacy for this prescription? Yes If no, delete pharmacy and type the correct one.   Has the prescription been filled recently? No  Is the patient out of the medication? No  Has the patient been seen for an appointment in the last year OR does the patient have an upcoming appointment? Yes  Can we respond through MyChart? Yes  Agent: Please be advised that Rx refills may take up to 3 business days. We ask that you follow-up with your pharmacy.

## 2023-12-25 NOTE — Telephone Encounter (Signed)
 Requested medication (s) are due for refill today - yes  Requested medication (s) are on the active medication list -yes  Future visit scheduled -yes  Last refill: 11/27/23 #60  Notes to clinic: non delegated Rx  Requested Prescriptions  Pending Prescriptions Disp Refills   amphetamine -dextroamphetamine  (ADDERALL) 10 MG tablet 60 tablet 0    Sig: Take 1 tablet (10 mg total) by mouth 2 (two) times daily.     Not Delegated - Psychiatry:  Stimulants/ADHD Failed - 12/25/2023  4:07 PM      Failed - This refill cannot be delegated      Failed - Urine Drug Screen completed in last 360 days      Failed - Last BP in normal range    BP Readings from Last 1 Encounters:  09/27/23 (!) 145/88         Failed - Last Heart Rate in normal range    Pulse Readings from Last 1 Encounters:  09/27/23 (!) 113         Passed - Valid encounter within last 6 months    Recent Outpatient Visits           2 months ago Annual physical exam   [facility] Gasper Nancyann BRAVO, MD                 Requested Prescriptions  Pending Prescriptions Disp Refills   amphetamine -dextroamphetamine  (ADDERALL) 10 MG tablet 60 tablet 0    Sig: Take 1 tablet (10 mg total) by mouth 2 (two) times daily.     Not Delegated - Psychiatry:  Stimulants/ADHD Failed - 12/25/2023  4:07 PM      Failed - This refill cannot be delegated      Failed - Urine Drug Screen completed in last 360 days      Failed - Last BP in normal range    BP Readings from Last 1 Encounters:  09/27/23 (!) 145/88         Failed - Last Heart Rate in normal range    Pulse Readings from Last 1 Encounters:  09/27/23 (!) 113         Passed - Valid encounter within last 6 months    Recent Outpatient Visits           2 months ago Annual physical exam   [facility] Gasper, Nancyann BRAVO, MD

## 2023-12-26 MED ORDER — AMPHETAMINE-DEXTROAMPHETAMINE 10 MG PO TABS: 10.0000 mg | ORAL_TABLET | Freq: Two times a day (BID) | ORAL | 0 refills | Status: DC

## 2024-01-24 ENCOUNTER — Encounter: Payer: Self-pay | Admitting: Family Medicine

## 2024-01-24 ENCOUNTER — Ambulatory Visit: Admitting: Family Medicine

## 2024-01-24 VITALS — BP 144/91 | HR 103 | Resp 16 | Wt 184.9 lb

## 2024-01-24 DIAGNOSIS — R000 Tachycardia, unspecified: Secondary | ICD-10-CM

## 2024-01-24 DIAGNOSIS — I4510 Unspecified right bundle-branch block: Secondary | ICD-10-CM | POA: Diagnosis not present

## 2024-01-24 DIAGNOSIS — F909 Attention-deficit hyperactivity disorder, unspecified type: Secondary | ICD-10-CM | POA: Diagnosis not present

## 2024-01-24 DIAGNOSIS — I10 Essential (primary) hypertension: Secondary | ICD-10-CM

## 2024-01-24 MED ORDER — AMPHETAMINE-DEXTROAMPHETAMINE 10 MG PO TABS: 10.0000 mg | ORAL_TABLET | Freq: Two times a day (BID) | ORAL | 0 refills | Status: AC

## 2024-01-24 MED ORDER — AMLODIPINE BESYLATE 5 MG PO TABS: 5.0000 mg | ORAL_TABLET | Freq: Every day | ORAL | 2 refills | Status: AC

## 2024-01-24 NOTE — Progress Notes (Signed)
 Established patient visit   Patient: Bruce Pittman   DOB: [birth year]   33 y.o. Male  MRN: [id number] Visit Date: 01/24/2024  Today's healthcare provider: Nancyann Perry, MD   Chief Complaint  Patient presents with   Medical Management of Chronic Issues    Follow-up BP and ADD   Subjective    Discussed the use of AI scribe software for clinical note transcription with the patient, who gave verbal consent to proceed.  History of Present Illness   Bruce Pittman is a 33 year old male with hypertension and ADD who presents for follow-up.  He was last seen four months ago for a physical, at which time his blood pressure was 145/88. He has not consistently followed dietary recommendations, though he has improved recently by eating more grilled chicken and salads. He does not monitor his blood pressure at home. No chest pain, heart flutters, or shortness of breath.  He is currently taking Adderall for ADD, which is working fine. He takes 10 mg in the morning and another dose after lunch. No trouble sleeping or resting at night.     Lab Results  Component Value Date   NA 137 09/25/2023   CL 99 09/25/2023   K 4.1 09/25/2023   CO2 22 09/25/2023   BUN 9 09/25/2023   CREATININE 0.98 09/25/2023   EGFR 106 09/25/2023   CALCIUM 9.9 09/25/2023   ALBUMIN 5.1 09/25/2023   GLUCOSE 90 09/25/2023   Lab Results  Component Value Date   CHOL 221 (H) 09/25/2023   HDL 87 09/25/2023   LDLCALC 120 (H) 09/25/2023   TRIG 82 09/25/2023   CHOLHDL 2.5 09/25/2023     Medications: Outpatient Medications Prior to Visit  Medication Sig   amphetamine -dextroamphetamine  (ADDERALL) 10 MG tablet Take 1 tablet (10 mg total) by mouth 2 (two) times daily.   No facility-administered medications prior to visit.   Review of Systems  Constitutional:  Negative for appetite change, chills and fever.  Respiratory:  Negative for chest tightness, shortness of breath and wheezing.   Cardiovascular:   Negative for chest pain and palpitations.  Gastrointestinal:  Negative for abdominal pain, nausea and vomiting.      Objective    BP (!) 144/91 (BP Location: Left Arm, Patient Position: Sitting, Cuff Size: Normal)   Pulse (!) 103   Resp 16   Wt 184 lb 14.4 oz (83.9 kg)   SpO2 99%   BMI 20.83 kg/m   Physical Exam   General: Appearance:    Well developed, well nourished male in no acute distress  Eyes:    PERRL, conjunctiva/corneas clear, EOM's intact       Lungs:     Clear to auscultation bilaterally, respirations unlabored  Heart:    Tachycardic. Normal rhythm. No murmurs, rubs, or gallops.    MS:   All extremities are intact.    Neurologic:   Awake, alert, oriented x 3. No apparent focal neurological defect.         Assessment & Plan    1. Primary hypertension (Primary) Start amLODipine (NORVASC) 5 MG tablet; Take 1 tablet (5 mg total) by mouth daily.  Dispense: 30 tablet; Refill: 2  2. Attention deficit hyperactivity disorder (ADHD), unspecified ADHD type Doing well on current dose of amphetamine -dextroamphetamine  (ADDERALL) 10 MG tablet; Take 1 tablet (10 mg total) by mouth 2 (two) times daily.  Dispense: 60 tablet; Refill: 0  3. Tachycardia  - EKG 12-Lead - ECHOCARDIOGRAM COMPLETE;  Future  4. Right bundle branch block (RBBB) on electrocardiogram (ECG)  He does display some physical features or Marfan's although he is not aware of any family history of it. - ECHOCARDIOGRAM COMPLETE; Future  Consider genetic testing  Other orders  Return in about 6 weeks (around 03/06/2024).     Nancyann Perry, MD  [facility] [phone] (phone) [phone] (fax)  [facility]

## 2024-01-24 NOTE — Patient Instructions (Signed)
 Bruce Pittman  Please review the attached list of medications and notify my office if there are any errors.   . Please bring all of your medications to every appointment so we can make sure that our medication list is the same as yours.

## 2024-03-11 ENCOUNTER — Ambulatory Visit: Admitting: Family Medicine
